# Patient Record
Sex: Male | Born: 1950 | Race: White | Hispanic: No | Marital: Married | Smoking: Former smoker
Health system: Southern US, Community
[De-identification: ages and names within clinical notes are randomized; demographics above are authoritative.]

## PROBLEM LIST (undated history)

## (undated) DIAGNOSIS — I10 Essential (primary) hypertension: Secondary | ICD-10-CM

## (undated) DIAGNOSIS — K635 Polyp of colon: Secondary | ICD-10-CM

## (undated) HISTORY — DX: Polyp of colon: K63.5

## (undated) HISTORY — PX: TONSILLECTOMY: SUR1361

## (undated) HISTORY — PX: EYE SURGERY: SHX253

---

## 1998-04-23 ENCOUNTER — Ambulatory Visit (HOSPITAL_COMMUNITY): Admission: RE | Admit: 1998-04-23 | Discharge: 1998-04-23 | Payer: Self-pay | Admitting: Gastroenterology

## 1999-08-26 LAB — HM COLONOSCOPY

## 2008-08-28 ENCOUNTER — Ambulatory Visit: Payer: Self-pay | Admitting: Internal Medicine

## 2008-08-28 DIAGNOSIS — N400 Enlarged prostate without lower urinary tract symptoms: Secondary | ICD-10-CM | POA: Insufficient documentation

## 2008-08-28 DIAGNOSIS — Z860100 Personal history of colon polyps, unspecified: Secondary | ICD-10-CM | POA: Insufficient documentation

## 2008-08-28 DIAGNOSIS — I10 Essential (primary) hypertension: Secondary | ICD-10-CM | POA: Insufficient documentation

## 2008-08-28 DIAGNOSIS — F17200 Nicotine dependence, unspecified, uncomplicated: Secondary | ICD-10-CM | POA: Insufficient documentation

## 2008-08-28 LAB — CONVERTED CEMR LAB
ALT: 25 units/L (ref 0–53)
AST: 28 units/L (ref 0–37)
Albumin: 4.1 g/dL (ref 3.5–5.2)
Alkaline Phosphatase: 80 units/L (ref 39–117)
BUN: 10 mg/dL (ref 6–23)
Basophils Absolute: 0 10*3/uL (ref 0.0–0.1)
Basophils Relative: 0.4 % (ref 0.0–3.0)
Bilirubin Urine: NEGATIVE
Bilirubin, Direct: 0.1 mg/dL (ref 0.0–0.3)
CO2: 33 meq/L — ABNORMAL HIGH (ref 19–32)
Calcium: 9.2 mg/dL (ref 8.4–10.5)
Chloride: 103 meq/L (ref 96–112)
Creatinine, Ser: 0.9 mg/dL (ref 0.4–1.5)
Eosinophils Absolute: 0.1 10*3/uL (ref 0.0–0.7)
Eosinophils Relative: 1.4 % (ref 0.0–5.0)
GFR calc non Af Amer: 92.79 mL/min (ref >60)
Glucose, Bld: 91 mg/dL (ref 70–99)
HCT: 44.3 % (ref 39.0–52.0)
Hemoglobin, Urine: NEGATIVE
Hemoglobin: 15.5 g/dL (ref 13.0–17.0)
Ketones, ur: NEGATIVE mg/dL
Leukocytes, UA: NEGATIVE
Lymphocytes Relative: 32.1 % (ref 12.0–46.0)
Lymphs Abs: 2.3 10*3/uL (ref 0.7–4.0)
MCHC: 35 g/dL (ref 30.0–36.0)
MCV: 95.1 fL (ref 78.0–100.0)
Monocytes Absolute: 0.4 10*3/uL (ref 0.1–1.0)
Monocytes Relative: 5.7 % (ref 3.0–12.0)
Neutro Abs: 4.5 10*3/uL (ref 1.4–7.7)
Neutrophils Relative %: 60.4 % (ref 43.0–77.0)
Nitrite: NEGATIVE
PSA: 0.33 ng/mL (ref 0.10–4.00)
Platelets: 123 10*3/uL — ABNORMAL LOW (ref 150.0–400.0)
Potassium: 4.2 meq/L (ref 3.5–5.1)
RBC: 4.66 M/uL (ref 4.22–5.81)
RDW: 13.5 % (ref 11.5–14.6)
Sodium: 142 meq/L (ref 135–145)
Specific Gravity, Urine: <=1.005 (ref 1.000–1.030)
TSH: 0.8 microintl units/mL (ref 0.35–5.50)
Total Bilirubin: 0.8 mg/dL (ref 0.3–1.2)
Total Protein, Urine: NEGATIVE mg/dL
Total Protein: 6.8 g/dL (ref 6.0–8.3)
Urine Glucose: NEGATIVE mg/dL
Urobilinogen, UA: 0.2 (ref 0.0–1.0)
WBC: 7.3 10*3/uL (ref 4.5–10.5)
pH: 6 (ref 5.0–8.0)

## 2008-08-29 ENCOUNTER — Encounter: Payer: Self-pay | Admitting: Internal Medicine

## 2008-09-11 ENCOUNTER — Ambulatory Visit: Payer: Self-pay | Admitting: Internal Medicine

## 2008-09-11 DIAGNOSIS — R9431 Abnormal electrocardiogram [ECG] [EKG]: Secondary | ICD-10-CM | POA: Insufficient documentation

## 2008-09-11 DIAGNOSIS — R0609 Other forms of dyspnea: Secondary | ICD-10-CM | POA: Insufficient documentation

## 2008-09-11 DIAGNOSIS — R0989 Other specified symptoms and signs involving the circulatory and respiratory systems: Secondary | ICD-10-CM | POA: Insufficient documentation

## 2008-09-18 ENCOUNTER — Telehealth (INDEPENDENT_AMBULATORY_CARE_PROVIDER_SITE_OTHER): Payer: Self-pay | Admitting: *Deleted

## 2008-12-05 ENCOUNTER — Encounter: Payer: Self-pay | Admitting: Internal Medicine

## 2008-12-23 ENCOUNTER — Ambulatory Visit: Payer: Self-pay | Admitting: Internal Medicine

## 2010-06-09 NOTE — Progress Notes (Signed)
Summary: Nuc pre procedure  Phone Note Outgoing Call Call back at [facility] Phone [phone]   Call placed by: Rea College, CMA,  Sep 18, 2008 3:37 PM Call placed to: Patient Summary of Call: Left message with information on Myoview Information Sheet (see scanned document for details).        Nuclear Med Background Indications for Stress Test: Evaluation for Ischemia, Abnormal EKG     Symptoms: DOE    Nuclear Pre-Procedure Cardiac Risk Factors: Hypertension, RBBB, Smoker Height (in): 69 Tech Comments: IRBBB  Appended Document: Nuc pre procedure Patient called 09/18/08 per IDX, cancelled myoview and patient will call back to reschedule.

## 2010-06-09 NOTE — Assessment & Plan Note (Signed)
Summary: 2 wk fu/$50/pn   Vital Signs:  Patient profile:   60 year old male Height:      69 inches Weight:      192.25 pounds O2 Sat:      98 % Temp:     98.4 degrees F oral Pulse rate:   92 / minute Pulse rhythm:   regular BP sitting:   150 / 90  (right arm) Cuff size:   large  Vitals Entered By: Rock Nephew CMA (Sep 11, 2008 1:49 PM) CC: 2wk follow up, Hypertension Management Is Patient Diabetic? No Pain Assessment Patient in pain? no        Primary Care Shaquela Weichert:  Etta Grandchild MD  CC:  2wk follow up and Hypertension Management.  History of Present Illness: Pt returns for f/up. He now reports some DOE with daily activities and is hesitant to exercise b/c of this.  Hypertension History:      He complains of dyspnea with exertion, but denies headache, chest pain, palpitations, orthopnea, PND, peripheral edema, visual symptoms, neurologic problems, syncope, and side effects from treatment.        Positive major cardiovascular risk factors include male age 83 years old or older, hypertension, and current tobacco user.  Negative major cardiovascular risk factors include no history of diabetes or hyperlipidemia and negative family history for ischemic heart disease.        Further assessment for target organ damage reveals no history of ASHD, cardiac end-organ damage (CHF/LVH), stroke/TIA, peripheral vascular disease, renal insufficiency, or hypertensive retinopathy.     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  The patient denies hoarseness, chest pain, syncope, peripheral edema, prolonged cough, headaches, hemoptysis, abdominal pain, hematuria, difficulty walking, enlarged lymph nodes, and angioedema.    Physical Exam  General:  alert, well-developed, well-nourished, and well-hydrated.   Neck:  supple, full ROM, and no masses.   Lungs:  Normal respiratory effort, chest expands symmetrically. Lungs are clear to auscultation, no crackles or wheezes. Heart:  Normal rate and regular rhythm. S1 and S2 normal without gallop, murmur, click, rub or other extra sounds. Abdomen:  soft, non-tender, normal bowel sounds, no distention, no masses, no guarding, no rigidity, no hepatomegaly, and no splenomegaly.   Msk:  normal ROM, no joint tenderness, and no joint swelling.  Pulses:  R and L carotid,radial,femoral,dorsalis pedis and posterior tibial pulses are full and equal bilaterally Extremities:  No clubbing, cyanosis, edema, or deformity noted with normal full range of motion of all joints.   Neurologic:  No cranial nerve deficits noted. Station and gait are normal. Plantar reflexes are down-going bilaterally. DTRs are symmetrical throughout. Sensory, motor and coordinative functions appear intact. Skin:  turgor normal, color normal, no rashes, no ulcerations, and no edema.   Cervical Nodes:  No lymphadenopathy noted Axillary Nodes:  No palpable lymphadenopathy Psych:  Cognition and judgment appear intact. Alert and cooperative with normal  attention span and concentration. No apparent delusions, illusions, hallucinations Additional Exam:  EKG shows LAE, RBBB, LAFB which obscure the T waves (flat). Also, there is a hint of a Q wave in V1 and V2.   Impression & Recommendations:  Problem # 1:  ELECTROCARDIOGRAM, ABNORMAL (ICD-794.31) Assessment New  Orders: EKG w/ Interpretation (93000) Cardiolite (Cardiolite)  Problem # 2:  DYSPNEA ON EXERTION (ICD-786.09) Assessment: New  Orders: EKG w/ Interpretation (93000) Cardiolite (Cardiolite)  Problem # 3:  ESSENTIAL HYPERTENSION (ICD-401.9) Assessment: New  His updated medication list for this problem includes:    Benicar 20 Mg Tabs (Olmesartan medoxomil) ..... Once daily  Orders: EKG w/ Interpretation (93000) Cardiolite (Cardiolite)  Complete Medication List: 1)  Benicar 20 Mg Tabs (Olmesartan medoxomil) .... Once daily  Hypertension Assessment/Plan:      The patient's hypertensive risk group is category B: At least one risk factor (excluding diabetes) with no target organ damage.  Today's blood pressure is 150/90.  His blood pressure goal is < 140/90.  Patient Instructions: 1)  Please schedule a follow-up appointment in 1 month. 2)  It is important that you exercise regularly at least 20 minutes 5 times a week. If you develop chest pain, have severe difficulty breathing, or feel very tired , stop exercising immediately and seek medical attention. 3)  You need to lose weight. Consider a lower calorie diet and regular exercise.  4)  Check your Blood Pressure regularly. If it is above 130/80: you should make an appointment. Prescriptions: BENICAR 20 MG TABS (OLMESARTAN MEDOXOMIL) once daily  #56 x 0   Entered and Authorized by:   Etta Grandchild MD   Signed by:   Etta Grandchild MD on 09/11/2008   Method used:   Historical   RxID:   [id number]

## 2010-06-09 NOTE — Assessment & Plan Note (Signed)
Summary: NEW PT  $50  MED COST -PKG/OFF-STC   Vital Signs:  Patient profile:   60 year old male Height:      69 inches (175.26 cm) Weight:      193.2 pounds (87.82 kg) BMI:     28.63 O2 Sat:      97 % Temp:     98.2 degrees F (36.78 degrees C) oral Pulse rate:   74 / minute BP supine:   132 / 90  (right arm) BP sitting:   128 / 92  (left arm) Cuff size:   large  Vitals Entered By: Orlan Leavens (August 28, 2008 4:12 PM)  Nutrition Counseling: Patient's BMI is greater than 25 and therefore counseled on weight management options. CC: New patient est., Preventive Care Is Patient Diabetic? No   Primary Care Provider:  Etta Grandchild MD  CC:  New patient est. and Preventive Care.  History of Present Illness: This is a new pt. to me who wants a complete check-up. He reports that his heart rate increases when he drinks a lot of caffeine but otherwise does not experience any CV complaints.  Preventive Screening-Counseling & Management     Alcohol drinks/day: <1     Alcohol type: beer     >5/day in last 3 mos: no     Alcohol Counseling: not indicated; use of alcohol is not excessive or problematic     Feels need to cut down: no     Feels annoyed by complaints: no     Feels guilty re: drinking: no     Needs 'eye opener' in am: yes     Smoking Status: current     Smoking Cessation Counseling: yes     Smoke Cessation Stage: precontemplative     Packs/Day: 1.0     Year Started: 1974     Pack years: 35     Does Patient Exercise: yes     Hepatitis Risk: no risk noted     HIV Risk: no risk noted     STD Risk: no risk noted     TSE monthly: yes     Testicular SE Education/Counseling to perform regular STE     Sun Exposure-Excessive: no     Sun Exposure Counseling: to decrease sun exposure     Seat Belt Use: yes     Helmet Use: yes     Firearms in the Home: no firearms in the home     Smoke Detectors: yes     Violence in the Home: no risk noted     Fall Risk Counseling: not  indicated; no significant falls noted      Sexual History:  currently monogamous.        Drug Use:  never.        Blood Transfusions:  no.    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  The patient denies anorexia, fever, weight loss, hoarseness, chest pain, syncope, dyspnea on exertion, peripheral edema, prolonged cough, headaches, hemoptysis, abdominal pain, melena, hematochezia, severe indigestion/heartburn, hematuria, incontinence, suspicious skin lesions, depression, unusual weight change, abnormal bleeding, enlarged lymph nodes, and testicular masses.   GU:  Complains of nocturia; denies decreased libido, discharge, dysuria, erectile dysfunction, genital sores, hematuria, incontinence, urinary frequency, and urinary hesitancy.  Physical Exam  General:  alert, well-developed, well-nourished, well-hydrated, appropriate dress, and overweight-appearing.   Head:  normocephalic and atraumatic.   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 Ears:  External ear exam shows no  significant lesions or deformities.  Otoscopic examination reveals clear canals, tympanic membranes are intact bilaterally without bulging, retraction, inflammation or discharge. Hearing is grossly normal bilaterally. Nose:  External nasal examination shows no deformity or inflammation. Nasal mucosa are pink and moist without lesions or exudates. Mouth:  Oral mucosa and oropharynx without lesions or exudates.  Teeth in good repair. Neck:  supple, full ROM, no masses, no thyromegaly, no thyroid nodules or tenderness, and no JVD.   Breasts:  No masses or gynecomastia noted Lungs:  Normal respiratory effort, chest expands symmetrically. Lungs are clear to auscultation, no crackles or wheezes. Heart:  Normal rate and regular rhythm. S1 and S2 normal without gallop, murmur, click, rub or other extra sounds. Abdomen:  soft, non-tender, normal bowel sounds, no distention, no masses, no guarding, no rigidity, no rebound tenderness, no abdominal hernia, no inguinal hernia, no hepatomegaly, and no splenomegaly.   Rectal:  heme negative stool.normal sphincter tone, no masses, no tenderness, no fissures, no fistulae, no perianal rash, and external hemorrhoid(s).   Genitalia:  circumcised, no hydrocele, no varicocele, no scrotal masses, no testicular masses or atrophy, no cutaneous lesions, and no urethral discharge.   Prostate:  no nodules, no asymmetry, no induration, and 1+ enlarged.   Msk:  normal ROM, no joint tenderness, no joint swelling, no joint warmth, no redness over joints, no joint deformities, no joint instability, and no crepitation.   Pulses:  R and L carotid,radial,femoral,dorsalis pedis and posterior tibial pulses are full and equal bilaterally Extremities:  No clubbing, cyanosis, edema, or deformity noted with normal full range of motion of all joints.   Neurologic:  alert & oriented X3, strength normal in all extremities, sensation intact to light touch, sensation intact to pinprick, gait  normal, and DTRs symmetrical and normal.   Skin:  turgor normal, color normal, no rashes, no suspicious lesions, no ecchymoses, no petechiae, no purpura, no ulcerations, and no edema.   Cervical Nodes:  No lymphadenopathy noted Axillary Nodes:  No palpable lymphadenopathy Psych:  Cognition and judgment appear intact. Alert and cooperative with normal attention span and concentration. No apparent delusions, illusions, hallucinations   Impression & Recommendations:  Problem # 1:  HYPERTROPHY PROSTATE W/UR OBST & OTH LUTS (ICD-600.01) Assessment New his symptoms are not prominent enough  to warrant meds or intervention at this time. Orders: TLB-PSA (Prostate Specific Antigen) (84153-PSA)  Problem # 2:  ELEVATED BLOOD PRESSURE WITHOUT DIAGNOSIS OF HYPERTENSION (ICD-796.2) Assessment: New  Orders: TLB-BMP (Basic Metabolic Panel-BMET) (80048-METABOL) TLB-CBC Platelet - w/Differential (85025-CBCD) TLB-Hepatic/Liver Function Pnl (80076-HEPATIC) TLB-TSH (Thyroid Stimulating Hormone) (84443-TSH) TLB-Udip w/ Micro (81001-URINE)  Problem # 3:  COLONIC POLYPS, HX OF (ICD-V12.72) Assessment: New  Orders: Gastroenterology Referral (GI)  Problem # 4:  TOBACCO USE (ICD-305.1) Assessment: Unchanged  Problem # 5:  Preventive Health Care (ICD-V70.0) He is not fasting today so no lipids were ordered.  Other Orders: Tdap => 34yrs IM (08657) Admin 1st Vaccine (84696)  Colorectal Screening:  Colonoscopy Results:    Date of Exam: 08/26/1999    Results: Adenomatous Polyp  Immunization & Chemoprophylaxis:    Tetanus vaccine: Tdap  (08/28/2008)  Patient Instructions: 1)  Please schedule a follow-up appointment in 2 weeks. 2)  Tobacco is very bad for your health and your loved ones! You Should stop smoking!. 3)  Stop Smoking Tips: Choose a Quit date. Cut down before the Quit date. decide what you will do as a substitute when you feel the urge to smoke(gum,toothpick,exercise). 4)  It is  important that you exercise regularly at least 20 minutes 5 times a week. If you develop chest pain, have severe difficulty breathing, or feel very tired , stop exercising immediately and seek medical attention. 5)  You need to lose weight. Consider a lower calorie diet and regular exercise.  6)  Check your Blood Pressure regularly. If it is above 140/90: you should make an appointment.      Preventive Care Screening  Hemoccult:    Date:  08/28/2008    Results:  negative  Last Tetanus Booster:    Date:  08/28/2008    Results:  Tdap   Colonoscopy:    Date:  05/11/1999    Results:  normal     Immunizations Administered:  Tetanus Vaccine:    Vaccine Type: Tdap    Site: left deltoid    Mfr: GlaxoSmithKline    Dose: 0.5 ml    Route: IM    Given by: Orlan Leavens    Exp. Date: 07/03/2010    Lot #: [id number]    VIS given: 08/28/08

## 2010-06-09 NOTE — Procedures (Signed)
Summary: Colonoscopy/[facility]  Colonoscopy/[facility]   Imported By: Sherian Rein 12/19/2008 13:18:36  _____________________________________________________________________  External Attachment:    Type:   Image     Comment:   External Document

## 2010-06-09 NOTE — Letter (Signed)
Summary: Results Follow-up Letter  [facility]  [address] [place], [place] [address]   Phone: [phone]  Fax: [phone]    08/29/2008  [address] [place], [place]  [address]  Dear Mr. Forton,   The following are the results of your recent test(s):  Test     Result     Prostate     normal Liver/kidney   normal Thyroid     normal Urine       normal CBC       normal  _________________________________________________________  Please call for an appointment as directed _________________________________________________________ _________________________________________________________ _________________________________________________________  Sincerely,  Sanda Linger MD [facility]           Appended Document: Results Follow-up Letter Mailed letter/LMB

## 2010-06-09 NOTE — Assessment & Plan Note (Signed)
Summary: MED REFILL/CD   Vital Signs:  Patient profile:   61 year old male Height:      69 inches Weight:      189 pounds BMI:     28.01 O2 Sat:      95 % on Room air Temp:     98.1 degrees F oral Pulse rate:   75 / minute Pulse rhythm:   regular Resp:     16 per minute BP sitting:   132 / 86  (left arm) Cuff size:   large  Vitals Entered By: Rock Nephew CMA (December 23, 2008 2:05 PM)  Nutrition Counseling: Patient's BMI is greater than 25 and therefore counseled on weight management options.  O2 Flow:  Room air CC: med refill, Hypertension Management   Primary Care Phyllis Abelson:  Etta Grandchild MD  CC:  med refill and Hypertension Management.  History of Present Illness: He returns for f/up and feels well. He's had no more DOE so he's decided not to do the cardiac testing. His BP at home stays around 130/60-70.   Hypertension History:      He denies headache, chest pain, palpitations, dyspnea with exertion, orthopnea, peripheral edema, visual symptoms, neurologic problems, syncope, and side effects from treatment.  He notes no problems with any antihypertensive medication side effects.        Positive major cardiovascular risk factors include male age 57 years old or older, hypertension, and current tobacco user.  Negative major cardiovascular risk factors include no history of diabetes or hyperlipidemia and negative family history for ischemic heart disease.        Further assessment for target organ damage reveals no history of ASHD, cardiac end-organ damage (CHF/LVH), stroke/TIA, peripheral vascular disease, renal insufficiency, or hypertensive retinopathy.     Preventive Screening-Counseling & Management  Alcohol-Tobacco     Smoking Status: current     Smoking Cessation Counseling: yes     Smoke Cessation Stage: contemplative     Packs/Day: 1.5     Year Started: 1975  Current Medications (verified): 1)  Benicar 20 Mg Tabs (Olmesartan Medoxomil) .... Once  Daily  Allergies (verified): No Known Drug Allergies  Past History:  Past Medical History: Reviewed history from 08/28/2008 and no changes required. Colonic polyps, hx of  Past Surgical History: Reviewed history from 08/28/2008 and no changes required. 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 Family History of Colon CA 1st degree relative <60 (parent)  Social History: Reviewed history from 08/28/2008 and no changes required. Current Smoker Alcohol use-yes Occupation: Horticulturist, commercial Married Regular exercise-yes Packs/Day:  1.5  Review of Systems  The patient denies anorexia, fever, prolonged cough, hemoptysis, abdominal pain, difficulty walking, and depression.    Physical Exam  General:  alert, well-developed, well-nourished, and well-hydrated.   Neck:  supple, full ROM, and no masses.   Lungs:  Normal respiratory effort, chest expands symmetrically. Lungs are clear to auscultation, no crackles or wheezes. Heart:  Normal rate and regular rhythm. S1 and S2 normal without gallop, murmur, click, rub or other extra sounds. Abdomen:  soft, non-tender, normal bowel sounds, no distention, no masses, no guarding, no rigidity, no hepatomegaly, and no splenomegaly.   Extremities:  No clubbing, cyanosis, edema, or deformity noted with normal full range of motion of all joints.   Skin:  turgor normal, color normal, no rashes, no ulcerations, and no edema.   Psych:  Cognition and judgment  appear intact. Alert and cooperative with normal attention span and concentration. No apparent delusions, illusions, hallucinations   Impression & Recommendations:  Problem # 1:  ELECTROCARDIOGRAM, ABNORMAL (ICD-794.31)  will do cardiolite when he's ready  Orders: Tobacco use cessation intermediate 3-10 minutes (16109)  Problem # 2:  ESSENTIAL HYPERTENSION (ICD-401.9) Assessment: Improved  His updated  medication list for this problem includes:    Benicar 20 Mg Tabs (Olmesartan medoxomil) ..... Once daily  Orders: Tobacco use cessation intermediate 3-10 minutes (60454)  Problem # 3:  TOBACCO USE (ICD-305.1) Assessment: Unchanged  Orders: Tobacco use cessation intermediate 3-10 minutes (99406)  Complete Medication List: 1)  Benicar 20 Mg Tabs (Olmesartan medoxomil) .... Once daily  Hypertension Assessment/Plan:      The patient's hypertensive risk group is category B: At least one risk factor (excluding diabetes) with no target organ damage.  Today's blood pressure is 132/86.  His blood pressure goal is < 140/90.  Patient Instructions: 1)  Please schedule a follow-up appointment in 4 months. 2)  Tobacco is very bad for your health and your loved ones! You Should stop smoking!. 3)  Stop Smoking Tips: Choose a Quit date. Cut down before the Quit date. decide what you will do as a substitute when you feel the urge to smoke(gum,toothpick,exercise). 4)  It is important that you exercise regularly at least 20 minutes 5 times a week. If you develop chest pain, have severe difficulty breathing, or feel very tired , stop exercising immediately and seek medical attention. 5)  You need to lose weight. Consider a lower calorie diet and regular exercise.  6)  Check your Blood Pressure regularly. If it is above 140/90: you should make an appointment. Prescriptions: BENICAR 20 MG TABS (OLMESARTAN MEDOXOMIL) once daily  #30 x 11   Entered and Authorized by:   Etta Grandchild MD   Signed by:   Etta Grandchild MD on 12/23/2008   Method used:   Print then Give to Patient   RxID:   [id number]

## 2010-08-26 ENCOUNTER — Ambulatory Visit (INDEPENDENT_AMBULATORY_CARE_PROVIDER_SITE_OTHER): Payer: PRIVATE HEALTH INSURANCE | Admitting: Internal Medicine

## 2010-08-26 ENCOUNTER — Encounter: Payer: Self-pay | Admitting: Internal Medicine

## 2010-08-26 ENCOUNTER — Other Ambulatory Visit (INDEPENDENT_AMBULATORY_CARE_PROVIDER_SITE_OTHER): Payer: PRIVATE HEALTH INSURANCE

## 2010-08-26 DIAGNOSIS — F17200 Nicotine dependence, unspecified, uncomplicated: Secondary | ICD-10-CM

## 2010-08-26 DIAGNOSIS — I10 Essential (primary) hypertension: Secondary | ICD-10-CM

## 2010-08-26 DIAGNOSIS — N401 Enlarged prostate with lower urinary tract symptoms: Secondary | ICD-10-CM

## 2010-08-26 DIAGNOSIS — N138 Other obstructive and reflux uropathy: Secondary | ICD-10-CM

## 2010-08-26 LAB — CBC WITH DIFFERENTIAL/PLATELET
Basophils Absolute: 0 10*3/uL (ref 0.0–0.1)
Basophils Relative: 0.3 % (ref 0.0–3.0)
Eosinophils Absolute: 0.1 10*3/uL (ref 0.0–0.7)
Eosinophils Relative: 1 % (ref 0.0–5.0)
HCT: 44.7 % (ref 39.0–52.0)
Hemoglobin: 15.5 g/dL (ref 13.0–17.0)
Lymphocytes Relative: 27.7 % (ref 12.0–46.0)
Lymphs Abs: 1.9 10*3/uL (ref 0.7–4.0)
MCHC: 34.7 g/dL (ref 30.0–36.0)
MCV: 94.9 fl (ref 78.0–100.0)
Monocytes Absolute: 0.3 10*3/uL (ref 0.1–1.0)
Monocytes Relative: 5.1 % (ref 3.0–12.0)
Neutro Abs: 4.4 10*3/uL (ref 1.4–7.7)
Neutrophils Relative %: 65.9 % (ref 43.0–77.0)
Platelets: 136 10*3/uL — ABNORMAL LOW (ref 150.0–400.0)
RBC: 4.71 Mil/uL (ref 4.22–5.81)
RDW: 14.3 % (ref 11.5–14.6)
WBC: 6.7 10*3/uL (ref 4.5–10.5)

## 2010-08-26 LAB — BASIC METABOLIC PANEL
BUN: 12 mg/dL (ref 6–23)
CO2: 30 mEq/L (ref 19–32)
Calcium: 9.7 mg/dL (ref 8.4–10.5)
Chloride: 102 mEq/L (ref 96–112)
Creatinine, Ser: 0.9 mg/dL (ref 0.4–1.5)
GFR: 90.97 mL/min (ref >60.00)
Glucose, Bld: 98 mg/dL (ref 70–99)
Potassium: 4.9 mEq/L (ref 3.5–5.1)
Sodium: 140 mEq/L (ref 135–145)

## 2010-08-26 LAB — PSA: PSA: 0.41 ng/mL (ref 0.10–4.00)

## 2010-08-26 MED ORDER — OLMESARTAN MEDOXOMIL 20 MG PO TABS: 20.0000 mg | ORAL_TABLET | Freq: Every day | ORAL | Status: DC

## 2010-08-26 NOTE — Assessment & Plan Note (Signed)
This is unchanged and he is not interested in quitting at this time

## 2010-08-26 NOTE — Patient Instructions (Signed)

## 2010-08-26 NOTE — Assessment & Plan Note (Signed)
He has no s/s today I will check his PSA to look for cancer concerns

## 2010-08-26 NOTE — Progress Notes (Signed)
Subjective:    Patient ID: Jaime Castillo, male    DOB: 09-06-1950, 60 y.o.   MRN: [id number]  Hypertension This is a chronic problem. The current episode started more than 1 year ago. The problem has been gradually improving since onset. The problem is controlled. Pertinent negatives include no anxiety, blurred vision, chest pain, headaches, malaise/fatigue, neck pain, orthopnea, palpitations, peripheral edema, PND, shortness of breath or sweats. There are no associated agents to hypertension. The current treatment provides significant improvement. There are no compliance problems.       Review of Systems  Constitutional: Negative for fever, chills, malaise/fatigue, diaphoresis, activity change, appetite change, fatigue and unexpected weight change.  HENT: Negative for facial swelling, neck pain and neck stiffness.   Eyes: Negative for blurred vision.  Respiratory: Negative for cough, choking, chest tightness, shortness of breath, wheezing and stridor.   Cardiovascular: Negative for chest pain, palpitations, orthopnea, leg swelling and PND.  Gastrointestinal: Negative for nausea, vomiting, abdominal pain, diarrhea, blood in stool and abdominal distention.  Genitourinary: Negative for dysuria, urgency, frequency, hematuria, decreased urine volume, enuresis and difficulty urinating.  Musculoskeletal: Negative for myalgias, back pain, joint swelling, arthralgias and gait problem.  Skin: Negative for color change, pallor and rash.  Neurological: Negative for dizziness, tremors, syncope, facial asymmetry, weakness, numbness and headaches.  Hematological: Negative for adenopathy. Does not bruise/bleed easily.  Psychiatric/Behavioral: Negative for behavioral problems, confusion, self-injury and agitation. The patient is not nervous/anxious.        Lab Results  Component Value Date   WBC 7.3 08/28/2008   HGB 15.5 08/28/2008   HCT 44.3 08/28/2008   PLT 123.0* 08/28/2008   ALT 25 08/28/2008   AST  28 08/28/2008   NA 142 08/28/2008   K 4.2 08/28/2008   CL 103 08/28/2008   CREATININE 0.9 08/28/2008   BUN 10 08/28/2008   CO2 33* 08/28/2008   TSH 0.80 08/28/2008   PSA 0.33 08/28/2008   Objective:   Physical Exam  Constitutional: He is oriented to person, place, and time. He appears well-developed and well-nourished. No distress.  HENT:  Head: Normocephalic and atraumatic.  Right Ear: External ear normal.  Left Ear: External ear normal.  Nose: Nose normal.  Mouth/Throat: Oropharynx is clear and moist. No oropharyngeal exudate.  Eyes: Conjunctivae and EOM are normal. Pupils are equal, round, and reactive to light. Right eye exhibits no discharge. Left eye exhibits no discharge. No scleral icterus.  Neck: Normal range of motion. Neck supple. No JVD present. No thyromegaly present.  Cardiovascular: Normal rate, regular rhythm, normal heart sounds and intact distal pulses.  Exam reveals no gallop and no friction rub.   No murmur heard. Pulmonary/Chest: Effort normal and breath sounds normal. No respiratory distress. He has no wheezes. He has no rales. He exhibits no tenderness.  Abdominal: Soft. Bowel sounds are normal. He exhibits no distension and no mass. There is no tenderness. There is no rebound and no guarding.  Musculoskeletal: Normal range of motion. He exhibits no edema and no tenderness.  Lymphadenopathy:    He has no cervical adenopathy.  Neurological: He is alert and oriented to person, place, and time. He has normal reflexes. He exhibits normal muscle tone. Coordination normal.  Skin: Skin is warm and dry. No rash noted. He is not diaphoretic. No erythema. No pallor.  Psychiatric: He has a normal mood and affect. His behavior is normal. Judgment and thought content normal.          Assessment &  Plan:

## 2010-08-26 NOTE — Assessment & Plan Note (Signed)
His BP is well controlled, I will monitor his lytes and renal function

## 2011-08-15 ENCOUNTER — Other Ambulatory Visit: Payer: Self-pay | Admitting: Internal Medicine

## 2012-02-22 ENCOUNTER — Other Ambulatory Visit: Payer: Self-pay | Admitting: Internal Medicine

## 2012-02-24 ENCOUNTER — Telehealth: Payer: Self-pay | Admitting: Internal Medicine

## 2012-02-24 NOTE — Telephone Encounter (Signed)
The patient has scheduled a cpe for next week but is in need of a refill until then, thanks!

## 2012-02-25 ENCOUNTER — Other Ambulatory Visit: Payer: Self-pay | Admitting: Internal Medicine

## 2012-02-25 ENCOUNTER — Telehealth: Payer: Self-pay | Admitting: Internal Medicine

## 2012-02-25 MED ORDER — OLMESARTAN MEDOXOMIL 20 MG PO TABS: 20.0000 mg | ORAL_TABLET | Freq: Every day | ORAL | Status: DC

## 2012-02-25 NOTE — Telephone Encounter (Signed)
Rx faxed to pharmacy  

## 2012-02-25 NOTE — Telephone Encounter (Signed)
Patient needs a refill on Benicar sent to [facility]

## 2012-03-01 ENCOUNTER — Ambulatory Visit (INDEPENDENT_AMBULATORY_CARE_PROVIDER_SITE_OTHER): Payer: PRIVATE HEALTH INSURANCE | Admitting: Internal Medicine

## 2012-03-01 ENCOUNTER — Other Ambulatory Visit (INDEPENDENT_AMBULATORY_CARE_PROVIDER_SITE_OTHER): Payer: PRIVATE HEALTH INSURANCE

## 2012-03-01 ENCOUNTER — Encounter: Payer: Self-pay | Admitting: Internal Medicine

## 2012-03-01 VITALS — BP 118/80 | HR 70 | Temp 97.0°F | Resp 14 | Ht 68.0 in | Wt 195.5 lb

## 2012-03-01 DIAGNOSIS — I10 Essential (primary) hypertension: Secondary | ICD-10-CM

## 2012-03-01 LAB — BASIC METABOLIC PANEL
BUN: 11 mg/dL (ref 6–23)
CO2: 31 mEq/L (ref 19–32)
Calcium: 9.2 mg/dL (ref 8.4–10.5)
Chloride: 101 mEq/L (ref 96–112)
Creatinine, Ser: 1 mg/dL (ref 0.4–1.5)
GFR: 83.08 mL/min (ref >60.00)
Glucose, Bld: 96 mg/dL (ref 70–99)
Potassium: 4.3 mEq/L (ref 3.5–5.1)
Sodium: 138 mEq/L (ref 135–145)

## 2012-03-01 NOTE — Assessment & Plan Note (Signed)
His BP is well controlled, I will check his lytes and renal function 

## 2012-03-01 NOTE — Progress Notes (Signed)
  Subjective:    Patient ID: Jaime Castillo, male    DOB: 05-Oct-1950, 61 y.o.   MRN: [id number]  Hypertension This is a chronic problem. The current episode started more than 1 year ago. The problem has been gradually improving since onset. The problem is controlled. Pertinent negatives include no anxiety, blurred vision, chest pain, headaches, malaise/fatigue, neck pain, orthopnea, palpitations, peripheral edema, PND, shortness of breath or sweats. There are no associated agents to hypertension. Past treatments include angiotensin blockers. Compliance problems include exercise and diet.       Review of Systems  Constitutional: Negative for fever, chills, malaise/fatigue, diaphoresis, activity change, appetite change, fatigue and unexpected weight change.  HENT: Negative.  Negative for neck pain.   Eyes: Negative.  Negative for blurred vision.  Respiratory: Negative for apnea, cough, choking, chest tightness, shortness of breath, wheezing and stridor.   Cardiovascular: Negative for chest pain, palpitations, orthopnea, leg swelling and PND.  Gastrointestinal: Negative.   Genitourinary: Negative.   Musculoskeletal: Negative.   Skin: Negative.   Neurological: Negative.  Negative for dizziness, facial asymmetry, weakness and headaches.  Hematological: Negative.   Psychiatric/Behavioral: Negative.        Objective:   Physical Exam  Vitals reviewed. Constitutional: He is oriented to person, place, and time. He appears well-developed and well-nourished. No distress.  HENT:  Head: Normocephalic and atraumatic.  Mouth/Throat: Oropharynx is clear and moist. No oropharyngeal exudate.  Eyes: Conjunctivae normal are normal. Right eye exhibits no discharge. Left eye exhibits no discharge. No scleral icterus.  Neck: Normal range of motion. Neck supple. No JVD present. No tracheal deviation present. No thyromegaly present.  Cardiovascular: Normal rate, regular rhythm, normal heart sounds and intact  distal pulses.  Exam reveals no gallop and no friction rub.   No murmur heard. Pulmonary/Chest: Effort normal and breath sounds normal. No stridor. No respiratory distress. He has no wheezes. He has no rales. He exhibits no tenderness.  Abdominal: Soft. Bowel sounds are normal. He exhibits no distension and no mass. There is no tenderness. There is no rebound and no guarding.  Musculoskeletal: Normal range of motion. He exhibits no edema and no tenderness.  Lymphadenopathy:    He has no cervical adenopathy.  Neurological: He is oriented to person, place, and time.  Skin: Skin is warm and dry. No rash noted. He is not diaphoretic. No erythema. No pallor.  Psychiatric: He has a normal mood and affect. His behavior is normal. Judgment and thought content normal.     Lab Results  Component Value Date   WBC 6.7 08/26/2010   HGB 15.5 08/26/2010   HCT 44.7 08/26/2010   PLT 136.0* 08/26/2010   GLUCOSE 98 08/26/2010   ALT 25 08/28/2008   AST 28 08/28/2008   NA 140 08/26/2010   K 4.9 08/26/2010   CL 102 08/26/2010   CREATININE 0.9 08/26/2010   BUN 12 08/26/2010   CO2 30 08/26/2010   TSH 0.80 08/28/2008   PSA 0.41 08/26/2010       Assessment & Plan:

## 2012-03-01 NOTE — Patient Instructions (Signed)

## 2012-08-08 ENCOUNTER — Other Ambulatory Visit (INDEPENDENT_AMBULATORY_CARE_PROVIDER_SITE_OTHER): Payer: PRIVATE HEALTH INSURANCE

## 2012-08-08 ENCOUNTER — Ambulatory Visit (INDEPENDENT_AMBULATORY_CARE_PROVIDER_SITE_OTHER): Payer: PRIVATE HEALTH INSURANCE | Admitting: Internal Medicine

## 2012-08-08 ENCOUNTER — Ambulatory Visit (INDEPENDENT_AMBULATORY_CARE_PROVIDER_SITE_OTHER)
Admission: RE | Admit: 2012-08-08 | Discharge: 2012-08-08 | Disposition: A | Discharge status: Home / Self Care | Payer: PRIVATE HEALTH INSURANCE | Source: Ambulatory Visit | Attending: Internal Medicine | Admitting: Internal Medicine

## 2012-08-08 ENCOUNTER — Encounter: Payer: Self-pay | Admitting: Internal Medicine

## 2012-08-08 VITALS — BP 122/78 | HR 72 | Temp 97.7°F | Resp 16 | Wt 179.8 lb

## 2012-08-08 DIAGNOSIS — M79669 Pain in unspecified lower leg: Secondary | ICD-10-CM | POA: Insufficient documentation

## 2012-08-08 DIAGNOSIS — I10 Essential (primary) hypertension: Secondary | ICD-10-CM

## 2012-08-08 DIAGNOSIS — Z0000 Encounter for general adult medical examination without abnormal findings: Secondary | ICD-10-CM

## 2012-08-08 DIAGNOSIS — F17200 Nicotine dependence, unspecified, uncomplicated: Secondary | ICD-10-CM

## 2012-08-08 DIAGNOSIS — M79609 Pain in unspecified limb: Secondary | ICD-10-CM

## 2012-08-08 DIAGNOSIS — Z86010 Personal history of colonic polyps: Secondary | ICD-10-CM

## 2012-08-08 LAB — COMPREHENSIVE METABOLIC PANEL
ALT: 22 U/L (ref 0–53)
AST: 22 U/L (ref 0–37)
Albumin: 4.4 g/dL (ref 3.5–5.2)
Alkaline Phosphatase: 69 U/L (ref 39–117)
BUN: 13 mg/dL (ref 6–23)
CO2: 29 mEq/L (ref 19–32)
Calcium: 9.5 mg/dL (ref 8.4–10.5)
Chloride: 101 mEq/L (ref 96–112)
Creatinine, Ser: 1 mg/dL (ref 0.4–1.5)
GFR: 81.99 mL/min (ref >60.00)
Glucose, Bld: 105 mg/dL — ABNORMAL HIGH (ref 70–99)
Potassium: 4.6 mEq/L (ref 3.5–5.1)
Sodium: 138 mEq/L (ref 135–145)
Total Bilirubin: 0.7 mg/dL (ref 0.3–1.2)
Total Protein: 6.9 g/dL (ref 6.0–8.3)

## 2012-08-08 LAB — CBC WITH DIFFERENTIAL/PLATELET
Basophils Absolute: 0 10*3/uL (ref 0.0–0.1)
Basophils Relative: 0.2 % (ref 0.0–3.0)
Eosinophils Absolute: 0.1 10*3/uL (ref 0.0–0.7)
Eosinophils Relative: 1.1 % (ref 0.0–5.0)
HCT: 44.5 % (ref 39.0–52.0)
Hemoglobin: 15.3 g/dL (ref 13.0–17.0)
Lymphocytes Relative: 24 % (ref 12.0–46.0)
Lymphs Abs: 1.8 10*3/uL (ref 0.7–4.0)
MCHC: 34.4 g/dL (ref 30.0–36.0)
MCV: 94.4 fl (ref 78.0–100.0)
Monocytes Absolute: 0.4 10*3/uL (ref 0.1–1.0)
Monocytes Relative: 5.3 % (ref 3.0–12.0)
Neutro Abs: 5.1 10*3/uL (ref 1.4–7.7)
Neutrophils Relative %: 69.4 % (ref 43.0–77.0)
Platelets: 153 10*3/uL (ref 150.0–400.0)
RBC: 4.72 Mil/uL (ref 4.22–5.81)
RDW: 13.7 % (ref 11.5–14.6)
WBC: 7.4 10*3/uL (ref 4.5–10.5)

## 2012-08-08 LAB — URINALYSIS, ROUTINE W REFLEX MICROSCOPIC
Bilirubin Urine: NEGATIVE
Hgb urine dipstick: NEGATIVE
Ketones, ur: NEGATIVE
Leukocytes, UA: NEGATIVE
Nitrite: NEGATIVE
Specific Gravity, Urine: <=1.005 (ref 1.000–1.030)
Total Protein, Urine: NEGATIVE
Urine Glucose: NEGATIVE
Urobilinogen, UA: 0.2 (ref 0.0–1.0)
pH: 6 (ref 5.0–8.0)

## 2012-08-08 LAB — TSH: TSH: 0.72 u[IU]/mL (ref 0.35–5.50)

## 2012-08-08 LAB — HEPATITIS C ANTIBODY: HCV Ab: NEGATIVE

## 2012-08-08 LAB — PSA: PSA: 0.4 ng/mL (ref 0.10–4.00)

## 2012-08-08 LAB — LIPID PANEL
Cholesterol: 161 mg/dL (ref 0–200)
HDL: 31.5 mg/dL — ABNORMAL LOW (ref >39.00)
LDL Cholesterol: 99 mg/dL (ref 0–99)
Total CHOL/HDL Ratio: 5
Triglycerides: 151 mg/dL — ABNORMAL HIGH (ref 0.0–149.0)
VLDL: 30.2 mg/dL (ref 0.0–40.0)

## 2012-08-08 LAB — FECAL OCCULT BLOOD, GUAIAC: Fecal Occult Blood: NEGATIVE

## 2012-08-08 MED ORDER — OLMESARTAN MEDOXOMIL 20 MG PO TABS: 20.0000 mg | ORAL_TABLET | Freq: Every day | ORAL | Status: DC

## 2012-08-08 NOTE — Assessment & Plan Note (Signed)
He is not willing to quit smoking 

## 2012-08-08 NOTE — Patient Instructions (Signed)
Health Maintenance, Males A healthy lifestyle and preventative care can promote health and wellness.  Maintain regular health, dental, and eye exams.  Eat a healthy diet. Foods like vegetables, fruits, whole grains, low-fat dairy products, and lean protein foods contain the nutrients you need without too many calories. Decrease your intake of foods high in solid fats, added sugars, and salt. Get information about a proper diet from your caregiver, if necessary.  Regular physical exercise is one of the most important things you can do for your health. Most adults should get at least 150 minutes of moderate-intensity exercise (any activity that increases your heart rate and causes you to sweat) each week. In addition, most adults need muscle-strengthening exercises on 2 or more days a week.   Maintain a healthy weight. The body mass index (BMI) is a screening tool to identify possible weight problems. It provides an estimate of body fat based on height and weight. Your caregiver can help determine your BMI, and can help you achieve or maintain a healthy weight. For adults 20 years and older:  A BMI below 18.5 is considered underweight.  A BMI of 18.5 to 24.9 is normal.  A BMI of 25 to 29.9 is considered overweight.  A BMI of 30 and above is considered obese.  Maintain normal blood lipids and cholesterol by exercising and minimizing your intake of saturated fat. Eat a balanced diet with plenty of fruits and vegetables. Blood tests for lipids and cholesterol should begin at age 20 and be repeated every 5 years. If your lipid or cholesterol levels are high, you are over 50, or you are a high risk for heart disease, you may need your cholesterol levels checked more frequently.Ongoing high lipid and cholesterol levels should be treated with medicines, if diet and exercise are not effective.  If you smoke, find out from your caregiver how to quit. If you do not use tobacco, do not start.  If you  choose to drink alcohol, do not exceed 2 drinks per day. One drink is considered to be 12 ounces (355 mL) of beer, 5 ounces (148 mL) of wine, or 1.5 ounces (44 mL) of liquor.  Avoid use of street drugs. Do not share needles with anyone. Ask for help if you need support or instructions about stopping the use of drugs.  High blood pressure causes heart disease and increases the risk of stroke. Blood pressure should be checked at least every 1 to 2 years. Ongoing high blood pressure should be treated with medicines if weight loss and exercise are not effective.  If you are 45 to 60 years old, ask your caregiver if you should take aspirin to prevent heart disease.  Diabetes screening involves taking a blood sample to check your fasting blood sugar level. This should be done once every 3 years, after age 45, if you are within normal weight and without risk factors for diabetes. Testing should be considered at a younger age or be carried out more frequently if you are overweight and have at least 1 risk factor for diabetes.  Colorectal cancer can be detected and often prevented. Most routine colorectal cancer screening begins at the age of 50 and continues through age 75. However, your caregiver may recommend screening at an earlier age if you have risk factors for colon cancer. On a yearly basis, your caregiver may provide home test kits to check for hidden blood in the stool. Use of a small camera at the end of a tube,   to directly examine the colon (sigmoidoscopy or colonoscopy), can detect the earliest forms of colorectal cancer. Talk to your caregiver about this at age 50, when routine screening begins. Direct examination of the colon should be repeated every 5 to 10 years through age 75, unless early forms of pre-cancerous polyps or small growths are found.  Hepatitis C blood testing is recommended for all people born from 1945 through 1965 and any individual with known risks for hepatitis C.  Healthy  men should no longer receive prostate-specific antigen (PSA) blood tests as part of routine cancer screening. Consult with your caregiver about prostate cancer screening.  Testicular cancer screening is not recommended for adolescents or adult males who have no symptoms. Screening includes self-exam, caregiver exam, and other screening tests. Consult with your caregiver about any symptoms you have or any concerns you have about testicular cancer.  Practice safe sex. Use condoms and avoid high-risk sexual practices to reduce the spread of sexually transmitted infections (STIs).  Use sunscreen with a sun protection factor (SPF) of 30 or greater. Apply sunscreen liberally and repeatedly throughout the day. You should seek shade when your shadow is shorter than you. Protect yourself by wearing long sleeves, pants, a wide-brimmed hat, and sunglasses year round, whenever you are outdoors.  Notify your caregiver of new moles or changes in moles, especially if there is a change in shape or color. Also notify your caregiver if a mole is larger than the size of a pencil eraser.  A one-time screening for abdominal aortic aneurysm (AAA) and surgical repair of large AAAs by sound wave imaging (ultrasonography) is recommended for ages 65 to 75 years who are current or former smokers.  Stay current with your immunizations. Document Released: 10/23/2007 Document Revised: 07/19/2011 Document Reviewed: 09/21/2010 [employer] Patient Information 2013 [employer].  

## 2012-08-08 NOTE — Assessment & Plan Note (Signed)
His BP is well controlled Today I will check his lytes and renal function 

## 2012-08-08 NOTE — Assessment & Plan Note (Signed)
He has been referred for a f/up colonoscopy

## 2012-08-08 NOTE — Assessment & Plan Note (Signed)
Exam done Vaccines were reviewed Labs ordered He was referred for a colonoscopy Pt ed material was given 

## 2012-08-08 NOTE — Progress Notes (Signed)
Subjective:    Patient ID: Jaime Castillo, male    DOB: 1950-06-17, 62 y.o.   MRN: [id number]  Hypertension This is a chronic problem. The current episode started more than 1 year ago. The problem has been gradually improving since onset. The problem is controlled. Pertinent negatives include no anxiety, blurred vision, chest pain, headaches, malaise/fatigue, neck pain, orthopnea, palpitations, peripheral edema, PND, shortness of breath or sweats. Risk factors for coronary artery disease include smoking/tobacco exposure. Past treatments include angiotensin blockers. The current treatment provides significant improvement. There are no compliance problems.       Review of Systems  Constitutional: Negative for fever, chills, malaise/fatigue, diaphoresis, activity change, appetite change, fatigue and unexpected weight change.  HENT: Negative.  Negative for neck pain.   Eyes: Negative.  Negative for blurred vision.  Respiratory: Negative.  Negative for cough, choking, chest tightness, shortness of breath, wheezing and stridor.   Cardiovascular: Negative.  Negative for chest pain, palpitations, orthopnea, leg swelling and PND.  Gastrointestinal: Positive for constipation. Negative for nausea, vomiting, abdominal pain, diarrhea, blood in stool, anal bleeding and rectal pain.  Endocrine: Negative.   Genitourinary: Negative.   Musculoskeletal: Positive for arthralgias (left lower leg pain for several months). Negative for myalgias, back pain, joint swelling and gait problem.  Skin: Negative for color change, pallor, rash and wound.  Allergic/Immunologic: Negative.   Neurological: Negative.  Negative for headaches.  Hematological: Negative.  Negative for adenopathy. Does not bruise/bleed easily.  Psychiatric/Behavioral: Negative.        Objective:   Physical Exam  Vitals reviewed. Constitutional: He is oriented to person, place, and time. He appears well-developed and well-nourished. No  distress.  HENT:  Head: Normocephalic and atraumatic.  Mouth/Throat: Oropharynx is clear and moist. No oropharyngeal exudate.  Eyes: Conjunctivae are normal. Right eye exhibits no discharge. Left eye exhibits no discharge. No scleral icterus.  Neck: Normal range of motion. Neck supple. No JVD present. No tracheal deviation present. No thyromegaly present.  Cardiovascular: Normal rate, regular rhythm, normal heart sounds and intact distal pulses.  Exam reveals no gallop and no friction rub.   No murmur heard. Pulmonary/Chest: Effort normal and breath sounds normal. No stridor. No respiratory distress. He has no wheezes. He has no rales. He exhibits no tenderness.  Abdominal: Soft. Bowel sounds are normal. He exhibits no distension and no mass. There is no tenderness. There is no rebound and no guarding. Hernia confirmed negative in the right inguinal area and confirmed negative in the left inguinal area.  Genitourinary: Prostate normal, testes normal and penis normal. Rectal exam shows external hemorrhoid and internal hemorrhoid. Rectal exam shows no fissure, no mass, no tenderness and anal tone normal. Guaiac negative stool. Prostate is not enlarged and not tender. Right testis shows no mass, no swelling and no tenderness. Right testis is descended. Left testis shows no mass, no swelling and no tenderness. Left testis is descended. Circumcised. No penile erythema or penile tenderness. No discharge found.  Musculoskeletal: Normal range of motion. He exhibits no edema and no tenderness.       Left lower leg: Normal. He exhibits no tenderness, no bony tenderness, no swelling, no edema, no deformity and no laceration.  Lymphadenopathy:    He has no cervical adenopathy.       Right: No inguinal adenopathy present.       Left: No inguinal adenopathy present.  Neurological: He is oriented to person, place, and time.  Skin: Skin is warm and dry. No  rash noted. He is not diaphoretic. No erythema. No pallor.   Psychiatric: He has a normal mood and affect. His behavior is normal. Judgment and thought content normal.     Lab Results  Component Value Date   WBC 6.7 08/26/2010   HGB 15.5 08/26/2010   HCT 44.7 08/26/2010   PLT 136.0* 08/26/2010   GLUCOSE 96 03/01/2012   ALT 25 08/28/2008   AST 28 08/28/2008   NA 138 03/01/2012   K 4.3 03/01/2012   CL 101 03/01/2012   CREATININE 1.0 03/01/2012   BUN 11 03/01/2012   CO2 31 03/01/2012   TSH 0.80 08/28/2008   PSA 0.41 08/26/2010       Assessment & Plan:

## 2012-08-09 ENCOUNTER — Encounter: Payer: Self-pay | Admitting: Internal Medicine

## 2012-09-26 ENCOUNTER — Encounter (HOSPITAL_COMMUNITY): Payer: Self-pay | Admitting: Pharmacy Technician

## 2012-10-03 ENCOUNTER — Encounter (HOSPITAL_COMMUNITY): Payer: Self-pay | Admitting: *Deleted

## 2012-10-11 ENCOUNTER — Other Ambulatory Visit: Payer: Self-pay | Admitting: Gastroenterology

## 2012-10-11 NOTE — Addendum Note (Signed)
Addended byVida Rigger on: 10/11/2012 11:44 AM   Modules accepted: Orders

## 2012-10-17 ENCOUNTER — Ambulatory Visit (HOSPITAL_COMMUNITY): Payer: PRIVATE HEALTH INSURANCE | Admitting: Anesthesiology

## 2012-10-17 ENCOUNTER — Encounter (HOSPITAL_COMMUNITY)
Admission: RE | Disposition: A | Discharge status: Home / Self Care | Payer: Self-pay | Source: Ambulatory Visit | Attending: Gastroenterology

## 2012-10-17 ENCOUNTER — Encounter (HOSPITAL_COMMUNITY): Payer: Self-pay | Admitting: Anesthesiology

## 2012-10-17 ENCOUNTER — Ambulatory Visit (HOSPITAL_COMMUNITY)
Admission: RE | Admit: 2012-10-17 | Discharge: 2012-10-17 | Disposition: A | Discharge status: Home / Self Care | Payer: PRIVATE HEALTH INSURANCE | Source: Ambulatory Visit | Attending: Gastroenterology | Admitting: Gastroenterology

## 2012-10-17 ENCOUNTER — Encounter (HOSPITAL_COMMUNITY): Payer: Self-pay | Admitting: *Deleted

## 2012-10-17 DIAGNOSIS — Z83719 Family history of colon polyps, unspecified: Secondary | ICD-10-CM | POA: Insufficient documentation

## 2012-10-17 DIAGNOSIS — Z860100 Personal history of colon polyps, unspecified: Secondary | ICD-10-CM | POA: Insufficient documentation

## 2012-10-17 DIAGNOSIS — F17200 Nicotine dependence, unspecified, uncomplicated: Secondary | ICD-10-CM | POA: Insufficient documentation

## 2012-10-17 DIAGNOSIS — I10 Essential (primary) hypertension: Secondary | ICD-10-CM | POA: Insufficient documentation

## 2012-10-17 DIAGNOSIS — K644 Residual hemorrhoidal skin tags: Secondary | ICD-10-CM | POA: Insufficient documentation

## 2012-10-17 DIAGNOSIS — K648 Other hemorrhoids: Secondary | ICD-10-CM | POA: Insufficient documentation

## 2012-10-17 DIAGNOSIS — Z1211 Encounter for screening for malignant neoplasm of colon: Secondary | ICD-10-CM | POA: Insufficient documentation

## 2012-10-17 DIAGNOSIS — Z800 Family history of malignant neoplasm of digestive organs: Secondary | ICD-10-CM | POA: Insufficient documentation

## 2012-10-17 DIAGNOSIS — D126 Benign neoplasm of colon, unspecified: Secondary | ICD-10-CM | POA: Insufficient documentation

## 2012-10-17 DIAGNOSIS — Z8371 Family history of colonic polyps: Secondary | ICD-10-CM | POA: Insufficient documentation

## 2012-10-17 HISTORY — DX: Essential (primary) hypertension: I10

## 2012-10-17 HISTORY — PX: HOT HEMOSTASIS: SHX5433

## 2012-10-17 HISTORY — PX: COLONOSCOPY WITH PROPOFOL: SHX5780

## 2012-10-17 SURGERY — COLONOSCOPY WITH PROPOFOL
Anesthesia: Monitor Anesthesia Care

## 2012-10-17 MED ORDER — FENTANYL CITRATE 0.05 MG/ML IJ SOLN
INTRAMUSCULAR | Status: DC | PRN
  Administered 2012-10-17: 100 ug via INTRAVENOUS

## 2012-10-17 MED ORDER — LACTATED RINGERS IV SOLN
INTRAVENOUS | Status: DC
Start: 2012-10-17 — End: 2012-10-17

## 2012-10-17 MED ORDER — PROPOFOL INFUSION 10 MG/ML OPTIME
INTRAVENOUS | Status: DC | PRN
  Administered 2012-10-17: 160 ug/kg/min via INTRAVENOUS

## 2012-10-17 MED ORDER — KETAMINE HCL 10 MG/ML IJ SOLN
INTRAMUSCULAR | Status: DC | PRN
  Administered 2012-10-17: 10 mg via INTRAVENOUS

## 2012-10-17 MED ORDER — PROMETHAZINE HCL 25 MG/ML IJ SOLN: 6.2500 mg | INTRAMUSCULAR | Status: DC | PRN

## 2012-10-17 MED ORDER — SODIUM CHLORIDE 0.9 % IV SOLN
INTRAVENOUS | Status: DC
  Administered 2012-10-17: 1000 mL via INTRAVENOUS

## 2012-10-17 MED ORDER — MIDAZOLAM HCL 5 MG/5ML IJ SOLN
INTRAMUSCULAR | Status: DC | PRN
  Administered 2012-10-17 (×2): 1 mg via INTRAVENOUS

## 2012-10-17 MED ORDER — MEPERIDINE HCL 100 MG/ML IJ SOLN: 6.2500 mg | INTRAMUSCULAR | Status: DC | PRN

## 2012-10-17 MED ORDER — SODIUM CHLORIDE 0.9 % IV SOLN
INTRAVENOUS | Status: DC
Start: 2012-10-17 — End: 2012-10-17
  Administered 2012-10-17: 07:00:00 via INTRAVENOUS

## 2012-10-17 MED ORDER — LIDOCAINE HCL 1 % IJ SOLN
INTRAMUSCULAR | Status: DC | PRN
  Administered 2012-10-17: 60 mg via INTRADERMAL

## 2012-10-17 SURGICAL SUPPLY — 22 items

## 2012-10-17 NOTE — Op Note (Signed)
[facility] [address] [place], [address]   COLONOSCOPY PROCEDURE REPORT  PATIENT: Jaime Castillo, Jaime Castillo  MR#: [id number] BIRTHDATE: May 11, 1950 , 62  yrs. old GENDER: Male ENDOSCOPIST: Jaime Rigger, MD REFERRED QI:ONGEXB Karsten Ro, M.D. PROCEDURE DATE:  10/17/2012 PROCEDURE:   Colonoscopy with biopsy ASA CLASS:   Class II INDICATIONS:Patient's personal history of adenomatous colon polyps and Patient's immediate family history of colon cancer. MEDICATIONS: propofol (Diprivan) 200mg  IV, Fentanyl 100 mcg IV, and Versed 2 mg IV  DESCRIPTION OF PROCEDURE:   After the risks benefits and alternatives of the procedure were thoroughly explained, informed consent was obtained.  The Pentax Ped Colon [id number]  endoscope was introduced through the anus and advanced to the cecum, which was identified by both the appendix and ileocecal valve , limited by No adverse events experienced. no obvious abnormality was seen on insertion and to advance to the cecum did require abdominal pressure but no position change  The quality of the prep was adequate. .  The instrument was then slowly withdrawn as the colon was fully examined.the findings are recorded below but specifically there was no signs of residual cecal polyp and the scope was slowly withdrawn and the patient tolerated the procedure well there was no obvious immediate complication       FINDINGS:  1 internal/external small hemorrhoids with anal papillae 2 questionable distal sigmoid polyp status post cold biopsy 3. Otherwise within normal limits to the cecum without obvious residual cecal polyp seen  COMPLICATIONS: none  IMPRESSION:  above  RECOMMENDATIONS: await pathology probable recheck in 4-5 years using propofol happy to  see back sooner when necessary   _______________________________ eSigned:  Vida Rigger, MD 10/17/2012 8:32 AM   [id number] Karsten Ro, MD

## 2012-10-17 NOTE — Transfer of Care (Signed)
Immediate Anesthesia Transfer of Care Note  Patient: Jaime Castillo  Procedure(s) Performed: Procedure(s): COLONOSCOPY WITH PROPOFOL (N/A) HOT HEMOSTASIS (ARGON PLASMA COAGULATION/BICAP) (N/A)  Patient Location: PACU and Endoscopy Unit  Anesthesia Type:MAC  Level of Consciousness: oriented, sedated and patient cooperative  Airway & Oxygen Therapy: Patient Spontanous Breathing and Patient connected to face mask oxygen  Post-op Assessment: Report given to PACU RN, Post -op Vital signs reviewed and stable and Patient moving all extremities  Post vital signs: Reviewed and stable  Complications: No apparent anesthesia complications

## 2012-10-17 NOTE — Anesthesia Postprocedure Evaluation (Signed)
  Anesthesia Post-op Note  Patient: Jaime Castillo  Procedure(s) Performed: Procedure(s) (LRB): COLONOSCOPY WITH PROPOFOL (N/A) HOT HEMOSTASIS (ARGON PLASMA COAGULATION/BICAP) (N/A)  Patient Location: PACU  Anesthesia Type: MAC  Level of Consciousness: awake and alert   Airway and Oxygen Therapy: Patient Spontanous Breathing  Post-op Pain: mild  Post-op Assessment: Post-op Vital signs reviewed, Patient's Cardiovascular Status Stable, Respiratory Function Stable, Patent Airway and No signs of Nausea or vomiting  Last Vitals:  Filed Vitals:   10/17/12 0858  BP: 122/79  Temp:   Resp: 18    Post-op Vital Signs: stable   Complications: No apparent anesthesia complications

## 2012-10-17 NOTE — Progress Notes (Signed)
Jaime Castillo 7:42 AM  Subjective: Patient without any GI problems and no major medical issues since I seen him in the office  Objective: Vital signs stable afebrile no acute distress exam please see pre-assessment evaluation  Assessment:  colon polyps family history of colon polyps overdue for colonic screening  Plan: Okay to proceed with anesthesia and colonoscopy today  [facility]

## 2012-10-17 NOTE — Anesthesia Preprocedure Evaluation (Signed)
Anesthesia Evaluation  Patient identified by MRN, date of birth, ID band Patient awake    Reviewed: Allergy & Precautions, H&P , NPO status , Patient's Chart, lab work & pertinent test results  Airway Mallampati: II TM Distance: >3 FB Neck ROM: Full    Dental no notable dental hx.    Pulmonary neg pulmonary ROS, Current Smoker,  breath sounds clear to auscultation  Pulmonary exam normal       Cardiovascular hypertension, Pt. on medications negative cardio ROS  Rhythm:Regular Rate:Normal     Neuro/Psych negative neurological ROS  negative psych ROS   GI/Hepatic negative GI ROS, Neg liver ROS,   Endo/Other  negative endocrine ROS  Renal/GU negative Renal ROS  negative genitourinary   Musculoskeletal negative musculoskeletal ROS (+)   Abdominal   Peds negative pediatric ROS (+)  Hematology negative hematology ROS (+)   Anesthesia Other Findings   Reproductive/Obstetrics negative OB ROS                           Anesthesia Physical Anesthesia Plan  ASA: II  Anesthesia Plan: MAC   Post-op Pain Management:    Induction:   Airway Management Planned: Simple Face Mask  Additional Equipment:   Intra-op Plan:   Post-operative Plan:   Informed Consent: I have reviewed the patients History and Physical, chart, labs and discussed the procedure including the risks, benefits and alternatives for the proposed anesthesia with the patient or authorized representative who has indicated his/her understanding and acceptance.   Dental advisory given  Plan Discussed with: CRNA  Anesthesia Plan Comments:         Anesthesia Quick Evaluation

## 2012-10-18 ENCOUNTER — Encounter (HOSPITAL_COMMUNITY): Payer: Self-pay | Admitting: Gastroenterology

## 2012-10-18 LAB — HM COLONOSCOPY

## 2013-08-09 ENCOUNTER — Ambulatory Visit (INDEPENDENT_AMBULATORY_CARE_PROVIDER_SITE_OTHER): Payer: PRIVATE HEALTH INSURANCE | Admitting: Internal Medicine

## 2013-08-09 ENCOUNTER — Encounter: Payer: Self-pay | Admitting: Internal Medicine

## 2013-08-09 ENCOUNTER — Other Ambulatory Visit (INDEPENDENT_AMBULATORY_CARE_PROVIDER_SITE_OTHER): Payer: PRIVATE HEALTH INSURANCE

## 2013-08-09 VITALS — BP 120/78 | HR 71 | Temp 98.8°F | Resp 16 | Ht 68.0 in | Wt 198.2 lb

## 2013-08-09 DIAGNOSIS — R7309 Other abnormal glucose: Secondary | ICD-10-CM

## 2013-08-09 DIAGNOSIS — R739 Hyperglycemia, unspecified: Secondary | ICD-10-CM | POA: Insufficient documentation

## 2013-08-09 DIAGNOSIS — I10 Essential (primary) hypertension: Secondary | ICD-10-CM

## 2013-08-09 DIAGNOSIS — J309 Allergic rhinitis, unspecified: Secondary | ICD-10-CM

## 2013-08-09 LAB — HEMOGLOBIN A1C: Hgb A1c MFr Bld: 6.1 % (ref 4.6–6.5)

## 2013-08-09 LAB — BASIC METABOLIC PANEL
BUN: 10 mg/dL (ref 6–23)
CO2: 29 mEq/L (ref 19–32)
Calcium: 9.4 mg/dL (ref 8.4–10.5)
Chloride: 99 mEq/L (ref 96–112)
Creatinine, Ser: 0.9 mg/dL (ref 0.4–1.5)
GFR: 87.82 mL/min (ref >60.00)
Glucose, Bld: 111 mg/dL — ABNORMAL HIGH (ref 70–99)
Potassium: 3.9 mEq/L (ref 3.5–5.1)
Sodium: 136 mEq/L (ref 135–145)

## 2013-08-09 MED ORDER — CETIRIZINE HCL 10 MG PO TABS: 10.0000 mg | ORAL_TABLET | Freq: Every day | ORAL | Status: DC

## 2013-08-09 MED ORDER — OLMESARTAN MEDOXOMIL 20 MG PO TABS: 20.0000 mg | ORAL_TABLET | Freq: Every morning | ORAL | Status: DC

## 2013-08-09 NOTE — Progress Notes (Signed)
Subjective:    Patient ID: Jaime Castillo, male    DOB: Dec 06, 1950, 64 y.o.   MRN: [id number]  Hypertension This is a chronic problem. The current episode started more than 1 year ago. The problem is unchanged. The problem is controlled. Pertinent negatives include no anxiety, blurred vision, chest pain, headaches, malaise/fatigue, neck pain, orthopnea, palpitations, peripheral edema, PND, shortness of breath or sweats. There are no associated agents to hypertension. Risk factors for coronary artery disease include smoking/tobacco exposure. Past treatments include angiotensin blockers. The current treatment provides moderate improvement. Compliance problems include diet and exercise.       Review of Systems  Constitutional: Negative.  Negative for fever, chills, malaise/fatigue, diaphoresis, appetite change and fatigue.  HENT: Positive for postnasal drip, rhinorrhea and sneezing. Negative for sinus pressure, sore throat, trouble swallowing and voice change.   Eyes: Negative.  Negative for blurred vision.  Respiratory: Negative.  Negative for cough, choking, chest tightness, shortness of breath and stridor.   Cardiovascular: Negative.  Negative for chest pain, palpitations, orthopnea, leg swelling and PND.  Gastrointestinal: Negative.  Negative for nausea, vomiting, abdominal pain, diarrhea, constipation and blood in stool.  Endocrine: Negative.  Negative for polydipsia, polyphagia and polyuria.  Genitourinary: Negative.   Musculoskeletal: Negative.  Negative for neck pain.  Skin: Negative.   Allergic/Immunologic: Negative.   Neurological: Negative.  Negative for dizziness, tremors, facial asymmetry, weakness, light-headedness, numbness and headaches.  Hematological: Negative.  Negative for adenopathy. Does not bruise/bleed easily.  Psychiatric/Behavioral: Negative.        Objective:   Physical Exam  Vitals reviewed. Constitutional: He is oriented to person, place, and time. He  appears well-developed and well-nourished. No distress.  HENT:  Head: Normocephalic and atraumatic.  Nose: Mucosal edema and rhinorrhea present. No sinus tenderness or nasal septal hematoma. No epistaxis. Right sinus exhibits no maxillary sinus tenderness and no frontal sinus tenderness. Left sinus exhibits no maxillary sinus tenderness and no frontal sinus tenderness.  Mouth/Throat: Oropharynx is clear and moist and mucous membranes are normal. Mucous membranes are not pale, not dry and not cyanotic. No oral lesions. No trismus in the jaw. No uvula swelling.  Eyes: Conjunctivae are normal. Right eye exhibits no discharge. Left eye exhibits no discharge. No scleral icterus.  Neck: Normal range of motion. Neck supple. No JVD present. No tracheal deviation present. No thyromegaly present.  Cardiovascular: Normal rate, regular rhythm, normal heart sounds and intact distal pulses.  Exam reveals no gallop and no friction rub.   No murmur heard. Pulmonary/Chest: Effort normal and breath sounds normal. No stridor. No respiratory distress. He has no wheezes. He has no rales. He exhibits no tenderness.  Abdominal: Soft. Bowel sounds are normal. He exhibits no distension and no mass. There is no tenderness. There is no rebound and no guarding.  Musculoskeletal: Normal range of motion. He exhibits no edema and no tenderness.  Lymphadenopathy:    He has no cervical adenopathy.  Neurological: He is oriented to person, place, and time.  Skin: Skin is warm and dry. No rash noted. He is not diaphoretic. No erythema. No pallor.  Psychiatric: He has a normal mood and affect. His behavior is normal. Judgment and thought content normal.     Lab Results  Component Value Date   WBC 7.4 08/08/2012   HGB 15.3 08/08/2012   HCT 44.5 08/08/2012   PLT 153.0 08/08/2012   GLUCOSE 105* 08/08/2012   CHOL 161 08/08/2012   TRIG 151.0* 08/08/2012   HDL  31.50* 08/08/2012   LDLCALC 99 08/08/2012   ALT 22 08/08/2012   AST 22 08/08/2012   NA  138 08/08/2012   K 4.6 08/08/2012   CL 101 08/08/2012   CREATININE 1.0 08/08/2012   BUN 13 08/08/2012   CO2 29 08/08/2012   TSH 0.72 08/08/2012   PSA 0.40 08/08/2012       Assessment & Plan:

## 2013-08-09 NOTE — Patient Instructions (Signed)

## 2013-08-09 NOTE — Assessment & Plan Note (Signed)
I will check his A1C to see if he has developed DM2 

## 2013-08-09 NOTE — Assessment & Plan Note (Signed)
His BP is well controlled I will check his lytes and renal function today 

## 2013-08-09 NOTE — Assessment & Plan Note (Signed)
Will treat with zyrtec

## 2013-08-13 ENCOUNTER — Telehealth: Payer: Self-pay | Admitting: Internal Medicine

## 2013-08-13 NOTE — Telephone Encounter (Signed)
Relevant patient education assigned to patient using Emmi. ° °

## 2014-02-08 IMAGING — CR DG TIBIA/FIBULA 2V*L*
4 series · 4 of 4 positions shown · non-contrast
Comparison: None.

CLINICAL DATA: Pain in the distal portion of the left lower leg.
No known injury. Beeping of metal detector.

LEFT TIBIA AND FIBULA - 2 VIEW

[view not recorded (1 of 4)]
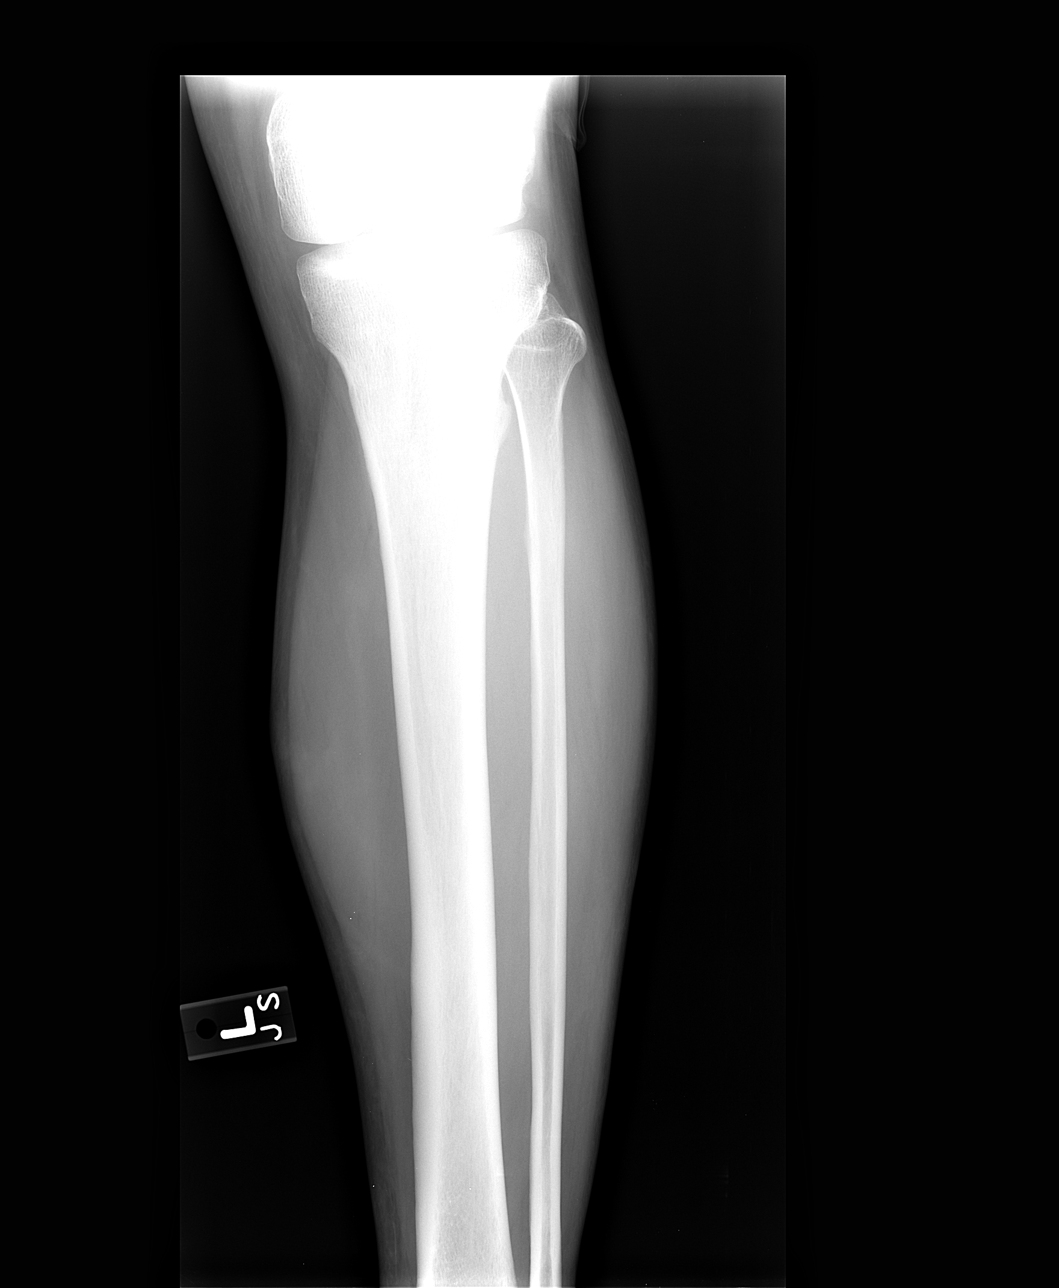

[view not recorded (2 of 4)]
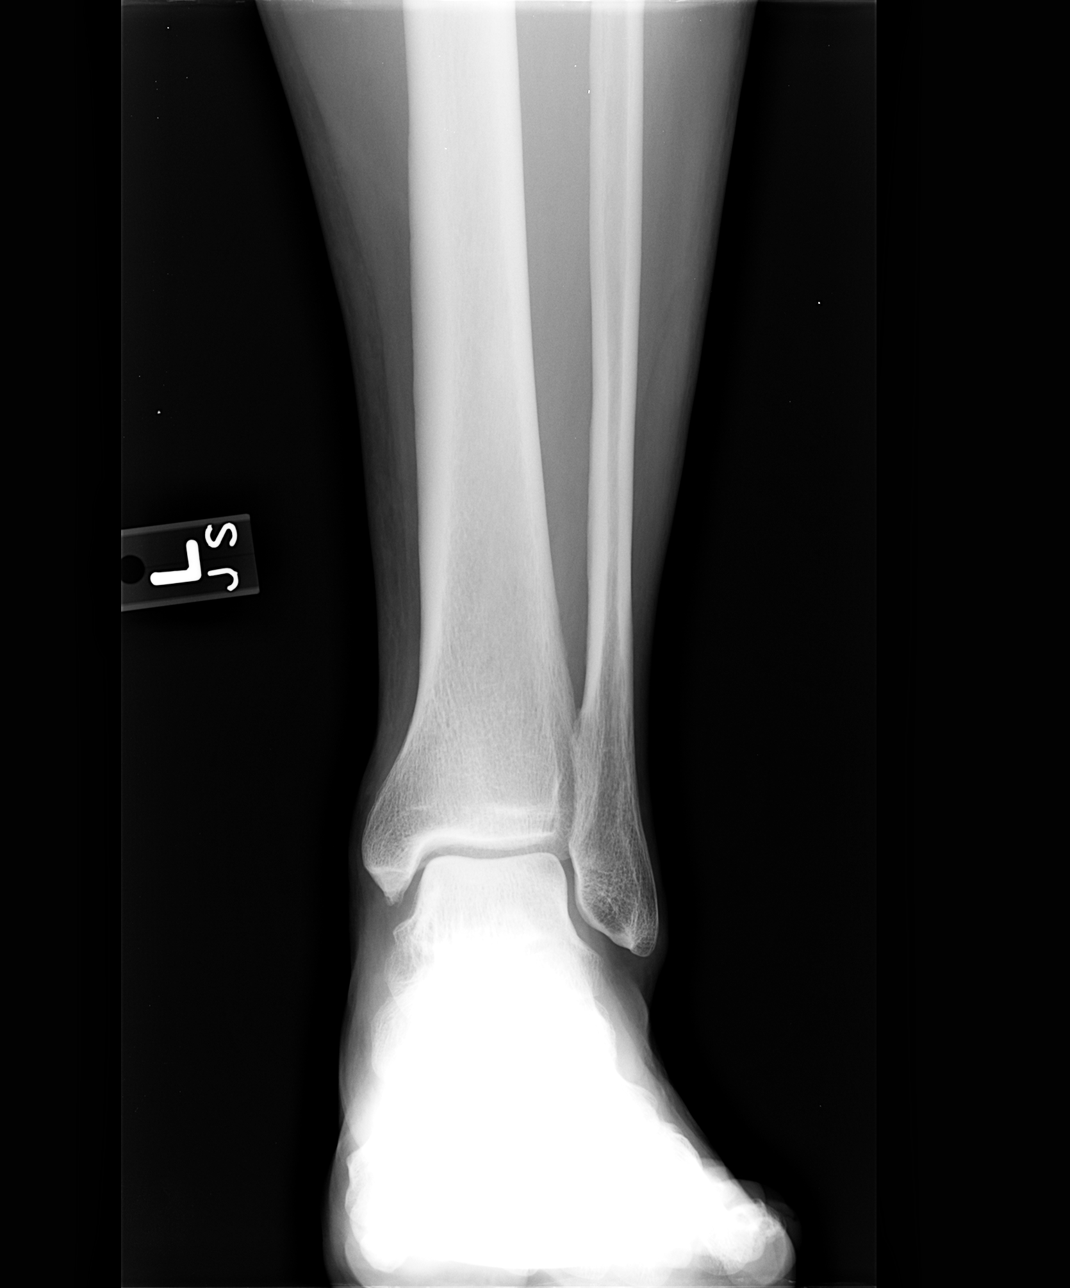

[view not recorded (3 of 4)]
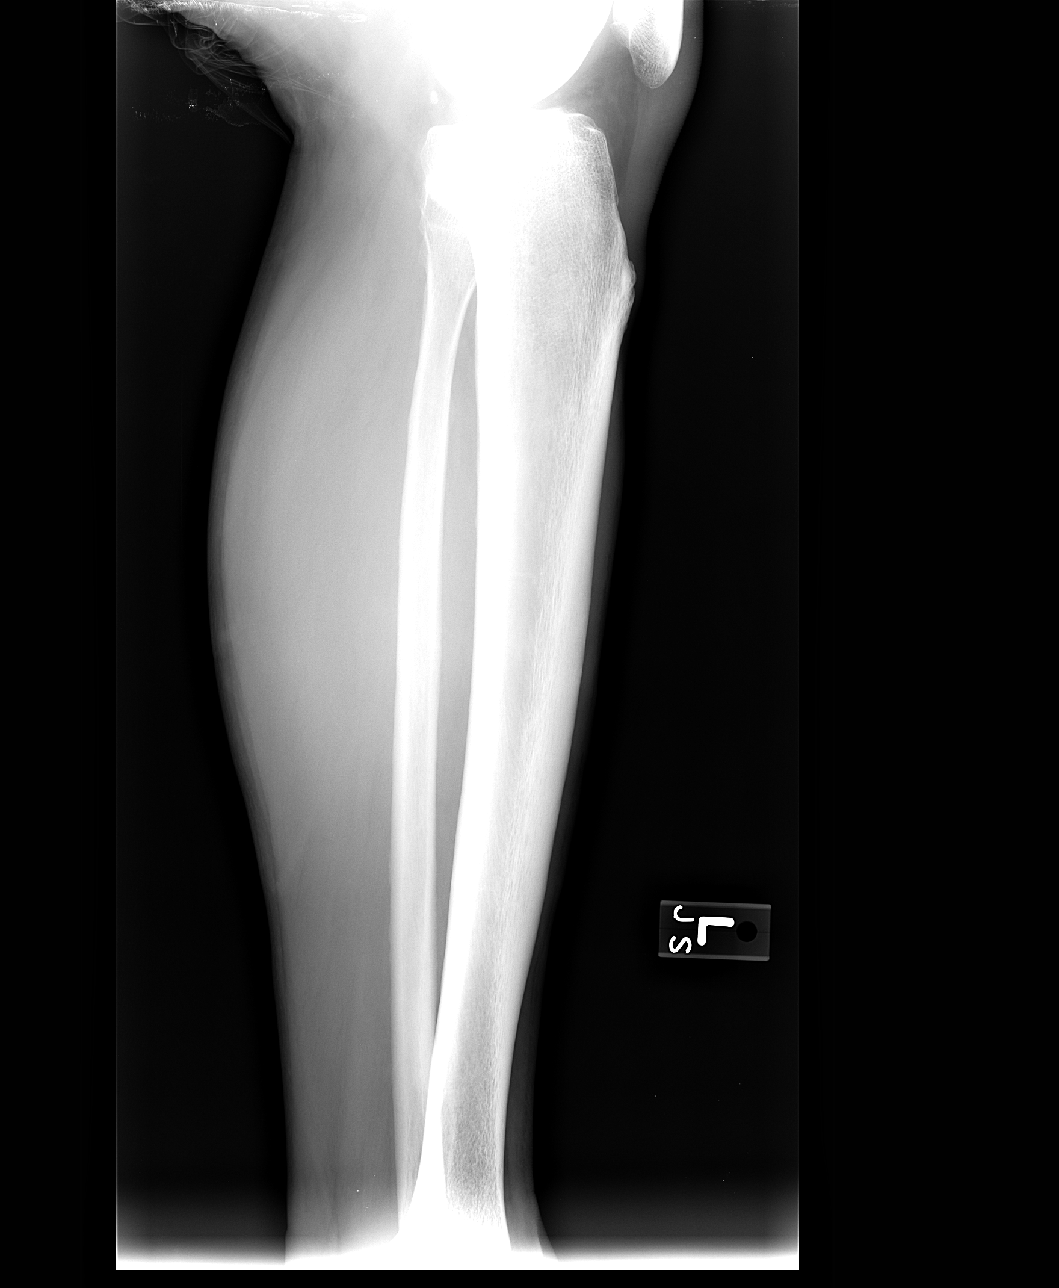

[view not recorded (4 of 4)]
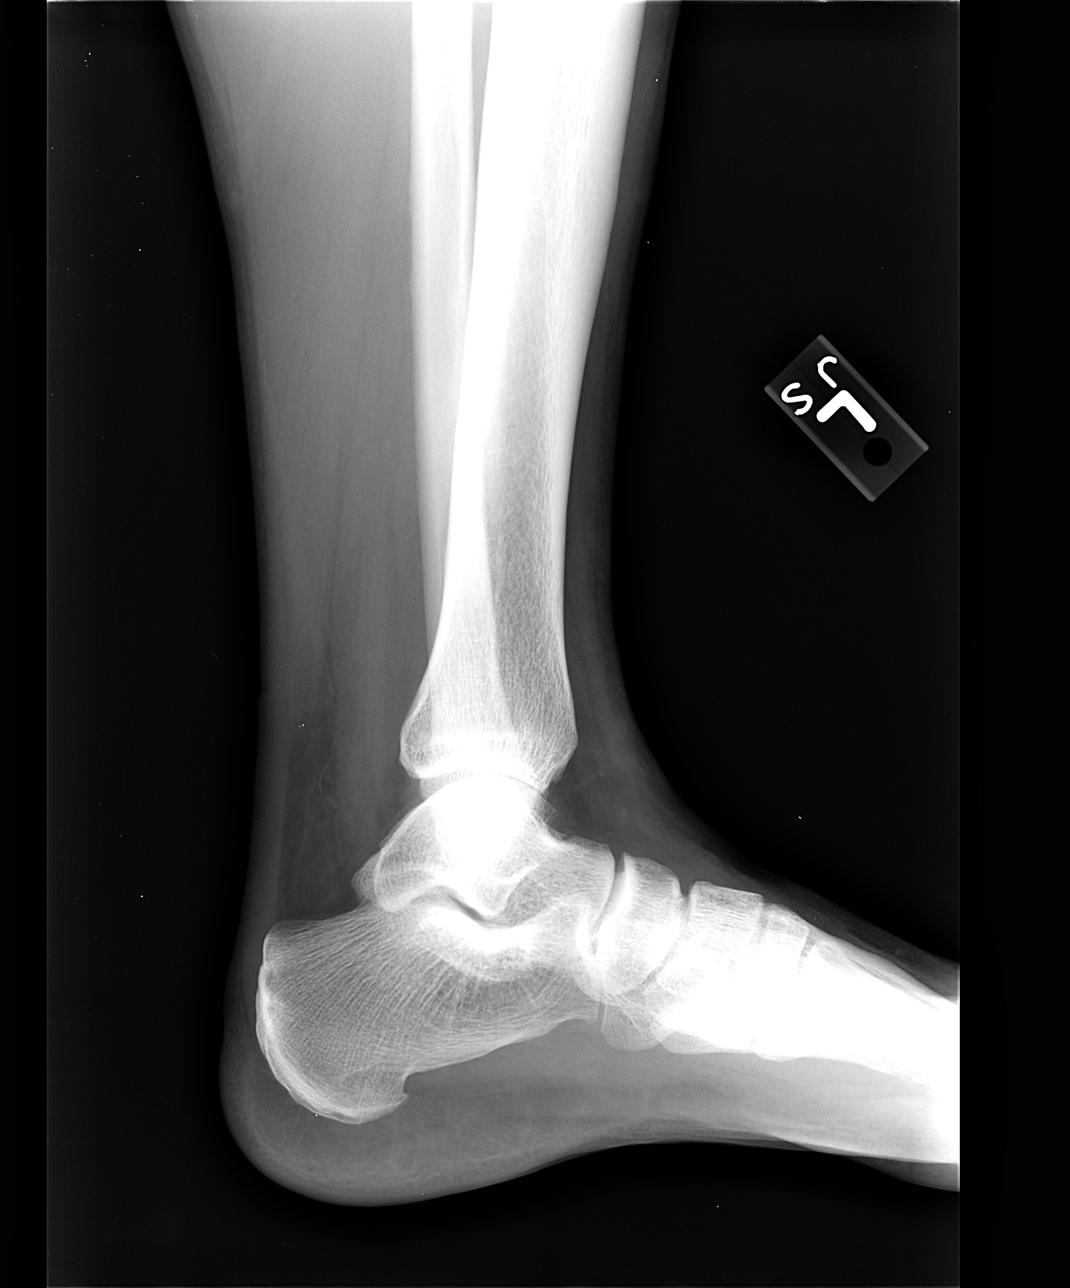

[4 of 4 positions shown; findings below may reference images not displayed]

FINDINGS: Alignment is normal.  Joint spaces are preserved.  No
fracture or dislocation is evident.  No soft tissue lesions are
seen. There is a small plantar calcaneal spur.
IMPRESSION: No fracture or bony destruction is seen.  No opaque metallic
density foreign body is evident.  There is a small plantar
calcaneal spur.

## 2014-07-31 ENCOUNTER — Ambulatory Visit (INDEPENDENT_AMBULATORY_CARE_PROVIDER_SITE_OTHER): Payer: PRIVATE HEALTH INSURANCE | Admitting: Internal Medicine

## 2014-07-31 ENCOUNTER — Other Ambulatory Visit: Payer: Self-pay | Admitting: Internal Medicine

## 2014-07-31 ENCOUNTER — Other Ambulatory Visit (INDEPENDENT_AMBULATORY_CARE_PROVIDER_SITE_OTHER): Payer: PRIVATE HEALTH INSURANCE

## 2014-07-31 ENCOUNTER — Encounter: Payer: Self-pay | Admitting: Internal Medicine

## 2014-07-31 VITALS — BP 128/76 | HR 76 | Temp 98.7°F | Resp 16 | Ht 68.0 in | Wt 193.0 lb

## 2014-07-31 DIAGNOSIS — R739 Hyperglycemia, unspecified: Secondary | ICD-10-CM | POA: Diagnosis not present

## 2014-07-31 DIAGNOSIS — R7989 Other specified abnormal findings of blood chemistry: Secondary | ICD-10-CM

## 2014-07-31 DIAGNOSIS — Z0000 Encounter for general adult medical examination without abnormal findings: Secondary | ICD-10-CM

## 2014-07-31 DIAGNOSIS — I10 Essential (primary) hypertension: Secondary | ICD-10-CM | POA: Diagnosis not present

## 2014-07-31 LAB — LIPID PANEL
Cholesterol: 144 mg/dL (ref 0–200)
HDL: 30.4 mg/dL — ABNORMAL LOW (ref >39.00)
NonHDL: 113.6
Total CHOL/HDL Ratio: 5
Triglycerides: 224 mg/dL — ABNORMAL HIGH (ref 0.0–149.0)
VLDL: 44.8 mg/dL — ABNORMAL HIGH (ref 0.0–40.0)

## 2014-07-31 LAB — CBC WITH DIFFERENTIAL/PLATELET
Basophils Absolute: 0 10*3/uL (ref 0.0–0.1)
Basophils Relative: 0.2 % (ref 0.0–3.0)
Eosinophils Absolute: 0.1 10*3/uL (ref 0.0–0.7)
Eosinophils Relative: 1.8 % (ref 0.0–5.0)
HCT: 42.8 % (ref 39.0–52.0)
Hemoglobin: 14.5 g/dL (ref 13.0–17.0)
Lymphocytes Relative: 13.3 % (ref 12.0–46.0)
Lymphs Abs: 1 10*3/uL (ref 0.7–4.0)
MCHC: 34 g/dL (ref 30.0–36.0)
MCV: 93.3 fl (ref 78.0–100.0)
Monocytes Absolute: 0.4 10*3/uL (ref 0.1–1.0)
Monocytes Relative: 5.4 % (ref 3.0–12.0)
Neutro Abs: 6.2 10*3/uL (ref 1.4–7.7)
Neutrophils Relative %: 79.3 % — ABNORMAL HIGH (ref 43.0–77.0)
Platelets: 134 10*3/uL — ABNORMAL LOW (ref 150.0–400.0)
RBC: 4.59 Mil/uL (ref 4.22–5.81)
RDW: 14.5 % (ref 11.5–15.5)
WBC: 7.8 10*3/uL (ref 4.0–10.5)

## 2014-07-31 LAB — COMPREHENSIVE METABOLIC PANEL
ALT: 18 U/L (ref 0–53)
AST: 19 U/L (ref 0–37)
Albumin: 4.4 g/dL (ref 3.5–5.2)
Alkaline Phosphatase: 80 U/L (ref 39–117)
BUN: 13 mg/dL (ref 6–23)
CO2: 32 mEq/L (ref 19–32)
Calcium: 9.4 mg/dL (ref 8.4–10.5)
Chloride: 101 mEq/L (ref 96–112)
Creatinine, Ser: 0.98 mg/dL (ref 0.40–1.50)
GFR: 82.41 mL/min (ref >60.00)
Glucose, Bld: 96 mg/dL (ref 70–99)
Potassium: 4.3 mEq/L (ref 3.5–5.1)
Sodium: 138 mEq/L (ref 135–145)
Total Bilirubin: 0.5 mg/dL (ref 0.2–1.2)
Total Protein: 6.7 g/dL (ref 6.0–8.3)

## 2014-07-31 LAB — TSH: TSH: 0.38 u[IU]/mL (ref 0.35–4.50)

## 2014-07-31 LAB — PSA: PSA: 0.42 ng/mL (ref 0.10–4.00)

## 2014-07-31 LAB — HEMOGLOBIN A1C: Hgb A1c MFr Bld: 6.1 % (ref 4.6–6.5)

## 2014-07-31 LAB — LDL CHOLESTEROL, DIRECT: Direct LDL: 97 mg/dL

## 2014-07-31 NOTE — Patient Instructions (Signed)

## 2014-07-31 NOTE — Progress Notes (Signed)
Subjective:    Patient ID: Jaime Castillo, male    DOB: 16-Jul-1950, 64 y.o.   MRN: [id number]  Hypertension This is a chronic problem. The current episode started more than 1 year ago. The problem has been gradually improving since onset. The problem is controlled. Pertinent negatives include no anxiety, blurred vision, chest pain, headaches, malaise/fatigue, neck pain, orthopnea, palpitations, peripheral edema, PND, shortness of breath or sweats. There are no associated agents to hypertension. Risk factors for coronary artery disease include smoking/tobacco exposure. Past treatments include angiotensin blockers. The current treatment provides moderate improvement. Compliance problems include diet and exercise.       Review of Systems  Constitutional: Negative.  Negative for fever, chills, malaise/fatigue, diaphoresis, appetite change and fatigue.  HENT: Negative.   Eyes: Negative.  Negative for blurred vision.  Respiratory: Negative.  Negative for cough, choking, chest tightness, shortness of breath and stridor.   Cardiovascular: Negative.  Negative for chest pain, palpitations, orthopnea, leg swelling and PND.  Gastrointestinal: Negative.  Negative for nausea, vomiting, abdominal pain, diarrhea, constipation and blood in stool.  Endocrine: Negative.   Genitourinary: Negative.  Negative for dysuria, urgency, frequency, hematuria, decreased urine volume and difficulty urinating.  Musculoskeletal: Negative.  Negative for back pain, arthralgias and neck pain.  Skin: Negative.   Allergic/Immunologic: Negative.   Neurological: Negative.  Negative for dizziness and headaches.  Hematological: Negative.  Negative for adenopathy. Does not bruise/bleed easily.  Psychiatric/Behavioral: Negative.        Objective:   Physical Exam  Constitutional: He is oriented to person, place, and time. He appears well-developed and well-nourished. No distress.  HENT:  Head: Normocephalic and atraumatic.    Mouth/Throat: Oropharynx is clear and moist. No oropharyngeal exudate.  Eyes: Conjunctivae are normal. Right eye exhibits no discharge. Left eye exhibits no discharge. No scleral icterus.  Neck: Normal range of motion. Neck supple. No JVD present. No tracheal deviation present. No thyromegaly present.  Cardiovascular: Normal rate, regular rhythm, normal heart sounds and intact distal pulses.  Exam reveals no gallop and no friction rub.   No murmur heard. Pulmonary/Chest: Effort normal and breath sounds normal. No stridor. No respiratory distress. He has no wheezes. He has no rales. He exhibits no tenderness.  Abdominal: Soft. Bowel sounds are normal. He exhibits no distension and no mass. There is no tenderness. There is no rebound and no guarding. Hernia confirmed negative in the right inguinal area and confirmed negative in the left inguinal area.  Genitourinary: Testes normal and penis normal. Rectal exam shows external hemorrhoid and internal hemorrhoid. Rectal exam shows no fissure, no mass, no tenderness and anal tone normal. Guaiac negative stool. Prostate is enlarged (1+ smooth symm BPH). Prostate is not tender. Right testis shows no mass, no swelling and no tenderness. Right testis is descended. Left testis shows no mass, no swelling and no tenderness. Left testis is descended. Circumcised. No penile erythema or penile tenderness. No discharge found.  Musculoskeletal: Normal range of motion. He exhibits no edema or tenderness.  Lymphadenopathy:    He has no cervical adenopathy.       Right: No inguinal adenopathy present.       Left: No inguinal adenopathy present.  Neurological: He is oriented to person, place, and time.  Skin: Skin is warm and dry. No rash noted. He is not diaphoretic. No erythema. No pallor.  Psychiatric: He has a normal mood and affect. His behavior is normal. Judgment and thought content normal.  Nursing note and  vitals reviewed.    Lab Results  Component Value  Date   WBC 7.4 08/08/2012   HGB 15.3 08/08/2012   HCT 44.5 08/08/2012   PLT 153.0 08/08/2012   GLUCOSE 111* 08/09/2013   CHOL 161 08/08/2012   TRIG 151.0* 08/08/2012   HDL 31.50* 08/08/2012   LDLCALC 99 08/08/2012   ALT 22 08/08/2012   AST 22 08/08/2012   NA 136 08/09/2013   K 3.9 08/09/2013   CL 99 08/09/2013   CREATININE 0.9 08/09/2013   BUN 10 08/09/2013   CO2 29 08/09/2013   TSH 0.72 08/08/2012   PSA 0.40 08/08/2012   HGBA1C 6.1 08/09/2013       Assessment & Plan:

## 2014-07-31 NOTE — Progress Notes (Signed)
Pre visit review using our clinic review tool, if applicable. No additional management support is needed unless otherwise documented below in the visit note. 

## 2014-08-01 ENCOUNTER — Encounter: Payer: Self-pay | Admitting: Internal Medicine

## 2014-08-01 LAB — FECAL OCCULT BLOOD, GUAIAC: Fecal Occult Blood: NEGATIVE

## 2014-08-01 NOTE — Assessment & Plan Note (Signed)
Vaccines were reviewed and updated Exam done Labs ordered Pt ed material was given 

## 2014-08-01 NOTE — Assessment & Plan Note (Signed)
His BP is well controlled Lytes and renal function as stable He agrees to work on his lifestyle modifications

## 2014-08-01 NOTE — Assessment & Plan Note (Signed)
Her has pre-diabetes His A1C is unchanged He will work on his lifestyle modifications

## 2014-08-10 ENCOUNTER — Other Ambulatory Visit: Payer: Self-pay | Admitting: Internal Medicine

## 2015-06-02 ENCOUNTER — Telehealth: Payer: Self-pay | Admitting: Internal Medicine

## 2015-06-02 NOTE — Telephone Encounter (Signed)
Patient has made yearly appt for march.  He is requesting benicar to be sent to [facility] at [facility].

## 2015-06-03 MED ORDER — OLMESARTAN MEDOXOMIL 20 MG PO TABS: 20.0000 mg | ORAL_TABLET | Freq: Every morning | ORAL | Status: DC

## 2015-08-04 ENCOUNTER — Encounter: Payer: Self-pay | Admitting: Internal Medicine

## 2015-08-04 ENCOUNTER — Other Ambulatory Visit (INDEPENDENT_AMBULATORY_CARE_PROVIDER_SITE_OTHER): Payer: BLUE CROSS/BLUE SHIELD

## 2015-08-04 ENCOUNTER — Ambulatory Visit (INDEPENDENT_AMBULATORY_CARE_PROVIDER_SITE_OTHER): Payer: BLUE CROSS/BLUE SHIELD | Admitting: Internal Medicine

## 2015-08-04 VITALS — BP 124/82 | HR 65 | Temp 98.9°F | Resp 16 | Ht 68.0 in | Wt 188.0 lb

## 2015-08-04 DIAGNOSIS — R739 Hyperglycemia, unspecified: Secondary | ICD-10-CM

## 2015-08-04 DIAGNOSIS — Z23 Encounter for immunization: Secondary | ICD-10-CM | POA: Diagnosis not present

## 2015-08-04 DIAGNOSIS — J301 Allergic rhinitis due to pollen: Secondary | ICD-10-CM | POA: Diagnosis not present

## 2015-08-04 DIAGNOSIS — I10 Essential (primary) hypertension: Secondary | ICD-10-CM

## 2015-08-04 DIAGNOSIS — Z0000 Encounter for general adult medical examination without abnormal findings: Secondary | ICD-10-CM | POA: Diagnosis not present

## 2015-08-04 LAB — LIPID PANEL
Cholesterol: 157 mg/dL (ref 0–200)
HDL: 32.6 mg/dL — ABNORMAL LOW (ref >39.00)
LDL Cholesterol: 87 mg/dL (ref 0–99)
NonHDL: 124.5
Total CHOL/HDL Ratio: 5
Triglycerides: 189 mg/dL — ABNORMAL HIGH (ref 0.0–149.0)
VLDL: 37.8 mg/dL (ref 0.0–40.0)

## 2015-08-04 LAB — COMPREHENSIVE METABOLIC PANEL
ALT: 15 U/L (ref 0–53)
AST: 18 U/L (ref 0–37)
Albumin: 4.5 g/dL (ref 3.5–5.2)
Alkaline Phosphatase: 72 U/L (ref 39–117)
BUN: 13 mg/dL (ref 6–23)
CO2: 28 mEq/L (ref 19–32)
Calcium: 9.5 mg/dL (ref 8.4–10.5)
Chloride: 102 mEq/L (ref 96–112)
Creatinine, Ser: 0.95 mg/dL (ref 0.40–1.50)
GFR: 85.14 mL/min (ref >60.00)
Glucose, Bld: 104 mg/dL — ABNORMAL HIGH (ref 70–99)
Potassium: 4 mEq/L (ref 3.5–5.1)
Sodium: 139 mEq/L (ref 135–145)
Total Bilirubin: 0.4 mg/dL (ref 0.2–1.2)
Total Protein: 6.8 g/dL (ref 6.0–8.3)

## 2015-08-04 LAB — URINALYSIS, ROUTINE W REFLEX MICROSCOPIC
Bilirubin Urine: NEGATIVE
Hgb urine dipstick: NEGATIVE
Ketones, ur: NEGATIVE
Leukocytes, UA: NEGATIVE
Nitrite: NEGATIVE
RBC / HPF: NONE SEEN (ref 0–?)
Specific Gravity, Urine: 1.01 (ref 1.000–1.030)
Total Protein, Urine: NEGATIVE
Urine Glucose: NEGATIVE
Urobilinogen, UA: 0.2 (ref 0.0–1.0)
WBC, UA: NONE SEEN (ref 0–?)
pH: 6.5 (ref 5.0–8.0)

## 2015-08-04 LAB — CBC WITH DIFFERENTIAL/PLATELET
Basophils Absolute: 0 10*3/uL (ref 0.0–0.1)
Basophils Relative: 0.6 % (ref 0.0–3.0)
Eosinophils Absolute: 0.1 10*3/uL (ref 0.0–0.7)
Eosinophils Relative: 1 % (ref 0.0–5.0)
HCT: 42.8 % (ref 39.0–52.0)
Hemoglobin: 14.4 g/dL (ref 13.0–17.0)
Lymphocytes Relative: 28.5 % (ref 12.0–46.0)
Lymphs Abs: 1.9 10*3/uL (ref 0.7–4.0)
MCHC: 33.5 g/dL (ref 30.0–36.0)
MCV: 93.9 fl (ref 78.0–100.0)
Monocytes Absolute: 0.4 10*3/uL (ref 0.1–1.0)
Monocytes Relative: 6 % (ref 3.0–12.0)
Neutro Abs: 4.2 10*3/uL (ref 1.4–7.7)
Neutrophils Relative %: 63.9 % (ref 43.0–77.0)
Platelets: 164 10*3/uL (ref 150.0–400.0)
RBC: 4.56 Mil/uL (ref 4.22–5.81)
RDW: 14.2 % (ref 11.5–15.5)
WBC: 6.6 10*3/uL (ref 4.0–10.5)

## 2015-08-04 LAB — TSH: TSH: 0.53 u[IU]/mL (ref 0.35–4.50)

## 2015-08-04 LAB — HEMOGLOBIN A1C: Hgb A1c MFr Bld: 6.2 % (ref 4.6–6.5)

## 2015-08-04 LAB — FECAL OCCULT BLOOD, GUAIAC: Fecal Occult Blood: NEGATIVE

## 2015-08-04 LAB — PSA: PSA: 0.42 ng/mL (ref 0.10–4.00)

## 2015-08-04 MED ORDER — AZELASTINE HCL 0.1 % NA SOLN: 2.0000 | Freq: Two times a day (BID) | NASAL | Status: DC

## 2015-08-04 MED ORDER — OLMESARTAN MEDOXOMIL 20 MG PO TABS: 20.0000 mg | ORAL_TABLET | Freq: Every morning | ORAL | Status: DC

## 2015-08-04 NOTE — Progress Notes (Signed)
Pre visit review using our clinic review tool, if applicable. No additional management support is needed unless otherwise documented below in the visit note. 

## 2015-08-04 NOTE — Patient Instructions (Signed)

## 2015-08-04 NOTE — Progress Notes (Signed)
Subjective:  Patient ID: Jaime Castillo, male    DOB: Oct 19, 1950  Age: 65 y.o. MRN: [id number]  CC: Hypertension; Allergic Rhinitis ; and Annual Exam   HPI Jaime Castillo presents for a CPX.  Jaime Castillo tells me that his blood pressure has been well controlled on the ARB. Jaime Castillo denies headache, blurred vision, chest pain, shortness of breath, edema, or dyspnea on exertion.  Jaime Castillo complains of nasal congestion. runny nose and postnasal drip due to chronic allergies. Jaime Castillo has been told by his eye doctor not to use an inhaled nasal steroid due to concerns about cataracts. Jaime Castillo has tried an oral antihistamine but says it makes him feel dried out. Jaime Castillo wants to know if there are any other treatments options for his nasal symptoms.  Outpatient Prescriptions Prior to Visit  Medication Sig Dispense Refill  . Multiple Vitamin (MULTIVITAMIN WITH MINERALS) TABS Take 1 tablet by mouth daily.    Marland Kitchen olmesartan (BENICAR) 20 MG tablet Take 1 tablet (20 mg total) by mouth every morning. 90 tablet 0   No facility-administered medications prior to visit.    ROS Review of Systems  Constitutional: Negative.  Negative for fever, chills, diaphoresis, activity change, appetite change and fatigue.  HENT: Positive for congestion, postnasal drip and rhinorrhea. Negative for facial swelling, nosebleeds, sinus pressure, sneezing, sore throat, tinnitus, trouble swallowing and voice change.   Eyes: Negative.  Negative for visual disturbance.  Respiratory: Negative.  Negative for cough, choking, chest tightness, shortness of breath and stridor.   Cardiovascular: Negative.  Negative for chest pain, palpitations and leg swelling.  Gastrointestinal: Negative.  Negative for nausea, vomiting, abdominal pain, diarrhea, constipation and blood in stool.  Endocrine: Negative.   Genitourinary: Negative.  Negative for dysuria, frequency, hematuria, penile swelling, enuresis, difficulty urinating and penile pain.  Musculoskeletal: Negative.   Negative for myalgias, back pain, joint swelling and arthralgias.  Skin: Negative.  Negative for color change, pallor and rash.  Allergic/Immunologic: Negative.   Neurological: Negative.  Negative for dizziness.  Hematological: Negative.  Negative for adenopathy. Does not bruise/bleed easily.  Psychiatric/Behavioral: Negative.     Objective:  BP 124/82 mmHg  Pulse 65  Temp(Src) 98.9 F (37.2 C) (Oral)  Resp 16  Ht 5\' 8"  (1.727 m)  Wt 188 lb (85.276 kg)  BMI 28.59 kg/m2  SpO2 97%  BP Readings from Last 3 Encounters:  08/04/15 124/82  07/31/14 128/76  08/09/13 120/78    Wt Readings from Last 3 Encounters:  08/04/15 188 lb (85.276 kg)  07/31/14 193 lb (87.544 kg)  08/09/13 198 lb 4 oz (89.926 kg)    Physical Exam  Constitutional: Jaime Castillo is oriented to person, place, and time. Jaime Castillo appears well-developed and well-nourished. No distress.  HENT:  Head: Normocephalic and atraumatic.  Nose: Mucosal edema and rhinorrhea present. No sinus tenderness, nasal deformity or septal deviation. No epistaxis. Right sinus exhibits no maxillary sinus tenderness and no frontal sinus tenderness. Left sinus exhibits no maxillary sinus tenderness and no frontal sinus tenderness.  Mouth/Throat: Oropharynx is clear and moist. No oropharyngeal exudate.  Eyes: Conjunctivae are normal. Right eye exhibits no discharge. Left eye exhibits no discharge. No scleral icterus.  Neck: Normal range of motion. Neck supple. No JVD present. No tracheal deviation present. No thyromegaly present.  Cardiovascular: Normal rate, regular rhythm, normal heart sounds and intact distal pulses.  Exam reveals no gallop and no friction rub.   No murmur heard. Pulmonary/Chest: Effort normal and breath sounds normal. No stridor. No respiratory distress.  Jaime Castillo has no wheezes. Jaime Castillo has no rales. Jaime Castillo exhibits no tenderness.  Abdominal: Soft. Bowel sounds are normal. Jaime Castillo exhibits no distension and no mass. There is no tenderness. There is no  rebound and no guarding. Hernia confirmed negative in the right inguinal area and confirmed negative in the left inguinal area.  Genitourinary: Penis normal. Rectal exam shows external hemorrhoid and internal hemorrhoid. Rectal exam shows no fissure, no mass, no tenderness and anal tone normal. Guaiac negative stool. Prostate is enlarged (1+ smooth symm BPH). Prostate is not tender. Right testis shows no mass, no swelling and no tenderness. Right testis is descended. Left testis shows no mass, no swelling and no tenderness. Left testis is descended. Circumcised. No paraphimosis, penile erythema or penile tenderness. No discharge found.  Musculoskeletal: Normal range of motion. Jaime Castillo exhibits no edema or tenderness.  Lymphadenopathy:    Jaime Castillo has no cervical adenopathy.       Right: No inguinal adenopathy present.       Left: No inguinal adenopathy present.  Neurological: Jaime Castillo is oriented to person, place, and time.  Skin: Skin is warm and dry. No rash noted. Jaime Castillo is not diaphoretic. No erythema. No pallor.  Vitals reviewed.   Lab Results  Component Value Date   WBC 6.6 08/04/2015   HGB 14.4 08/04/2015   HCT 42.8 08/04/2015   PLT 164.0 08/04/2015   GLUCOSE 104* 08/04/2015   CHOL 157 08/04/2015   TRIG 189.0* 08/04/2015   HDL 32.60* 08/04/2015   LDLDIRECT 97.0 07/31/2014   LDLCALC 87 08/04/2015   ALT 15 08/04/2015   AST 18 08/04/2015   NA 139 08/04/2015   K 4.0 08/04/2015   CL 102 08/04/2015   CREATININE 0.95 08/04/2015   BUN 13 08/04/2015   CO2 28 08/04/2015   TSH 0.53 08/04/2015   PSA 0.42 08/04/2015   HGBA1C 6.2 08/04/2015    No results found.  Assessment & Plan:   Jaime Castillo was seen today for hypertension, allergic rhinitis  and annual exam.  Diagnoses and all orders for this visit:  Allergic rhinitis due to pollen- I have asked him to start using Astelin nasal spray -     azelastine (ASTELIN) 0.1 % nasal spray; Place 2 sprays into both nostrils 2 (two) times daily. Use in each  nostril as directed  Hyperglycemia- his hemoglobin A1c is 6.2%, Jaime Castillo is prediabetic, no medications are needed at this time, I have asked him to improve his lifestyle modifications with diet/exercise/weight loss. -     Hemoglobin A1c; Future  Routine general medical examination at a health care facility- vaccines were reviewed and updated, colonoscopy is up-to-date, exam completed, labs ordered and reviewed, patient education material was given. -     Lipid panel; Future -     Comprehensive metabolic panel; Future -     CBC with Differential/Platelet; Future -     TSH; Future -     Urinalysis, Routine w reflex microscopic (not at [facility]); Future -     PSA; Future  Essential hypertension, benign- his blood pressure is adequately well controlled, his electrolytes and renal function are stable, I've asked him to improve his lifestyle modifications. -     olmesartan (BENICAR) 20 MG tablet; Take 1 tablet (20 mg total) by mouth every morning.  Need for vaccination with 13-polyvalent pneumococcal conjugate vaccine -     Pneumococcal conjugate vaccine 13-valent   I am having Jaime Castillo start on azelastine. I am also having him maintain his multivitamin with minerals,  olmesartan, and clindamycin.  Meds ordered this encounter  Medications  . olmesartan (BENICAR) 20 MG tablet    Sig: Take 1 tablet (20 mg total) by mouth every morning.    Dispense:  90 tablet    Refill:  3  . clindamycin (CLEOCIN) 300 MG capsule    Sig:     Refill:  0  . azelastine (ASTELIN) 0.1 % nasal spray    Sig: Place 2 sprays into both nostrils 2 (two) times daily. Use in each nostril as directed    Dispense:  30 mL    Refill:  12     Follow-up: Return in about 6 months (around 02/04/2016).  Scarlette Calico, MD

## 2015-08-08 ENCOUNTER — Telehealth: Payer: Self-pay

## 2015-08-08 NOTE — Telephone Encounter (Signed)
Patient wanted a return call when shingles vaccine arrived in our office (we were out of shingles vaccine for about 1 week prior)---i have advised patient that shingles vaccine is now here in our office, patient wants to check with his insurance company to make sure of copay/coverage/out of pocket cost----patient will call back to schedule nurse visit when he is ready for vaccine

## 2016-01-31 ENCOUNTER — Other Ambulatory Visit: Payer: Self-pay | Admitting: Internal Medicine

## 2016-06-29 ENCOUNTER — Other Ambulatory Visit: Payer: Self-pay | Admitting: *Deleted

## 2016-06-29 MED ORDER — OLMESARTAN MEDOXOMIL 20 MG PO TABS: 20.0000 mg | ORAL_TABLET | Freq: Every morning | ORAL | 0 refills | Status: DC

## 2016-06-30 ENCOUNTER — Other Ambulatory Visit: Payer: Self-pay | Admitting: *Deleted

## 2016-06-30 MED ORDER — OLMESARTAN MEDOXOMIL 20 MG PO TABS: 20.0000 mg | ORAL_TABLET | Freq: Every morning | ORAL | 1 refills | Status: DC

## 2016-06-30 NOTE — Telephone Encounter (Signed)
Rec'd call pt states he made CPX appt for 4/3, but he will be running out of his Benicar b4 appt. Verified pharmacy inform can send enough until his appt...Jaime Castillo

## 2016-08-10 ENCOUNTER — Other Ambulatory Visit (INDEPENDENT_AMBULATORY_CARE_PROVIDER_SITE_OTHER): Payer: BLUE CROSS/BLUE SHIELD

## 2016-08-10 ENCOUNTER — Ambulatory Visit (INDEPENDENT_AMBULATORY_CARE_PROVIDER_SITE_OTHER): Payer: BLUE CROSS/BLUE SHIELD | Admitting: Internal Medicine

## 2016-08-10 ENCOUNTER — Encounter: Payer: Self-pay | Admitting: Internal Medicine

## 2016-08-10 VITALS — BP 130/78 | HR 66 | Temp 98.4°F | Resp 16 | Ht 68.0 in | Wt 191.1 lb

## 2016-08-10 DIAGNOSIS — R739 Hyperglycemia, unspecified: Secondary | ICD-10-CM

## 2016-08-10 DIAGNOSIS — I10 Essential (primary) hypertension: Secondary | ICD-10-CM | POA: Diagnosis not present

## 2016-08-10 DIAGNOSIS — Z0000 Encounter for general adult medical examination without abnormal findings: Secondary | ICD-10-CM

## 2016-08-10 DIAGNOSIS — E785 Hyperlipidemia, unspecified: Secondary | ICD-10-CM

## 2016-08-10 DIAGNOSIS — Z86010 Personal history of colonic polyps: Secondary | ICD-10-CM

## 2016-08-10 DIAGNOSIS — R7989 Other specified abnormal findings of blood chemistry: Secondary | ICD-10-CM | POA: Diagnosis not present

## 2016-08-10 LAB — CBC WITH DIFFERENTIAL/PLATELET
Basophils Absolute: 0 10*3/uL (ref 0.0–0.1)
Basophils Relative: 0.5 % (ref 0.0–3.0)
Eosinophils Absolute: 0.1 10*3/uL (ref 0.0–0.7)
Eosinophils Relative: 1.2 % (ref 0.0–5.0)
HCT: 44.9 % (ref 39.0–52.0)
Hemoglobin: 15 g/dL (ref 13.0–17.0)
Lymphocytes Relative: 28.6 % (ref 12.0–46.0)
Lymphs Abs: 1.8 10*3/uL (ref 0.7–4.0)
MCHC: 33.5 g/dL (ref 30.0–36.0)
MCV: 94.4 fl (ref 78.0–100.0)
Monocytes Absolute: 0.4 10*3/uL (ref 0.1–1.0)
Monocytes Relative: 6.3 % (ref 3.0–12.0)
Neutro Abs: 4.1 10*3/uL (ref 1.4–7.7)
Neutrophils Relative %: 63.4 % (ref 43.0–77.0)
Platelets: 141 10*3/uL — ABNORMAL LOW (ref 150.0–400.0)
RBC: 4.75 Mil/uL (ref 4.22–5.81)
RDW: 14.2 % (ref 11.5–15.5)
WBC: 6.5 10*3/uL (ref 4.0–10.5)

## 2016-08-10 LAB — URINALYSIS, ROUTINE W REFLEX MICROSCOPIC
Bilirubin Urine: NEGATIVE
Hgb urine dipstick: NEGATIVE
Ketones, ur: NEGATIVE
Leukocytes, UA: NEGATIVE
Nitrite: NEGATIVE
RBC / HPF: NONE SEEN (ref 0–?)
Specific Gravity, Urine: 1.015 (ref 1.000–1.030)
Total Protein, Urine: NEGATIVE
Urine Glucose: NEGATIVE
Urobilinogen, UA: 0.2 (ref 0.0–1.0)
WBC, UA: NONE SEEN (ref 0–?)
pH: 7 (ref 5.0–8.0)

## 2016-08-10 LAB — COMPREHENSIVE METABOLIC PANEL
ALT: 18 U/L (ref 0–53)
AST: 19 U/L (ref 0–37)
Albumin: 4.4 g/dL (ref 3.5–5.2)
Alkaline Phosphatase: 63 U/L (ref 39–117)
BUN: 9 mg/dL (ref 6–23)
CO2: 29 mEq/L (ref 19–32)
Calcium: 9.3 mg/dL (ref 8.4–10.5)
Chloride: 105 mEq/L (ref 96–112)
Creatinine, Ser: 0.92 mg/dL (ref 0.40–1.50)
GFR: 88.06 mL/min (ref >60.00)
Glucose, Bld: 99 mg/dL (ref 70–99)
Potassium: 4 mEq/L (ref 3.5–5.1)
Sodium: 140 mEq/L (ref 135–145)
Total Bilirubin: 0.4 mg/dL (ref 0.2–1.2)
Total Protein: 6.5 g/dL (ref 6.0–8.3)

## 2016-08-10 LAB — LIPID PANEL
Cholesterol: 171 mg/dL (ref 0–200)
HDL: 34 mg/dL — ABNORMAL LOW (ref >39.00)
NonHDL: 137.27
Total CHOL/HDL Ratio: 5
Triglycerides: 259 mg/dL — ABNORMAL HIGH (ref 0.0–149.0)
VLDL: 51.8 mg/dL — ABNORMAL HIGH (ref 0.0–40.0)

## 2016-08-10 LAB — HEMOGLOBIN A1C: Hgb A1c MFr Bld: 6.1 % (ref 4.6–6.5)

## 2016-08-10 LAB — TSH: TSH: 0.48 u[IU]/mL (ref 0.35–4.50)

## 2016-08-10 LAB — PSA: PSA: 0.36 ng/mL (ref 0.10–4.00)

## 2016-08-10 LAB — LDL CHOLESTEROL, DIRECT: Direct LDL: 105 mg/dL

## 2016-08-10 MED ORDER — ROSUVASTATIN CALCIUM 10 MG PO TABS: 10.0000 mg | ORAL_TABLET | Freq: Every day | ORAL | 3 refills | Status: DC

## 2016-08-10 MED ORDER — ASPIRIN EC 81 MG PO TBEC: 81.0000 mg | DELAYED_RELEASE_TABLET | Freq: Every day | ORAL | 3 refills | Status: DC

## 2016-08-10 NOTE — Progress Notes (Signed)
Pre visit review using our clinic review tool, if applicable. No additional management support is needed unless otherwise documented below in the visit note. 

## 2016-08-10 NOTE — Progress Notes (Signed)
Subjective:  Patient ID: Jaime Castillo, male    DOB: 06-13-1950  Age: 66 y.o. MRN: [id number]  CC: Annual Exam and Hypertension   HPI Jaime Castillo presents for a CPX.  He feels well and offers no complaints. He tells me his blood pressure has been well controlled. He has had no recent episodes of chest pain, shortness of breath, dyspnea on exertion, fatigue, palpitations, or edema.  Outpatient Medications Prior to Visit  Medication Sig Dispense Refill  . azelastine (ASTELIN) 0.1 % nasal spray Place 2 sprays into both nostrils 2 (two) times daily. Use in each nostril as directed 30 mL 12  . olmesartan (BENICAR) 20 MG tablet Take 1 tablet (20 mg total) by mouth every morning. Must keep April appt for future refills 30 tablet 1  . clindamycin (CLEOCIN) 300 MG capsule   0  . Multiple Vitamin (MULTIVITAMIN WITH MINERALS) TABS Take 1 tablet by mouth daily.    Marland Kitchen olmesartan (BENICAR) 20 MG tablet Take 1 tablet (20 mg total) by mouth every morning. 90 tablet 3   No facility-administered medications prior to visit.     ROS Review of Systems  Constitutional: Positive for unexpected weight change (some wt gain). Negative for activity change, appetite change, diaphoresis and fatigue.  HENT: Negative.   Eyes: Negative for visual disturbance.  Respiratory: Negative for cough, chest tightness, shortness of breath and wheezing.   Cardiovascular: Negative for chest pain, palpitations and leg swelling.  Gastrointestinal: Negative for abdominal pain, constipation, diarrhea, nausea and vomiting.  Endocrine: Negative.  Negative for cold intolerance and heat intolerance.  Genitourinary: Negative.  Negative for decreased urine volume, difficulty urinating, frequency, hematuria, penile swelling, scrotal swelling and testicular pain.  Musculoskeletal: Negative for arthralgias, back pain, myalgias and neck pain.  Skin: Negative.   Allergic/Immunologic: Negative.   Neurological: Negative.  Negative for  dizziness, weakness, numbness and headaches.  Hematological: Negative.  Negative for adenopathy. Does not bruise/bleed easily.  Psychiatric/Behavioral: Negative.     Objective:  BP 130/78 (BP Location: Left Arm, Patient Position: Sitting, Cuff Size: Normal)   Pulse 66   Temp 98.4 F (36.9 C) (Oral)   Resp 16   Ht 5\' 8"  (1.727 m)   Wt 191 lb 1.9 oz (86.7 kg)   SpO2 94%   BMI 29.06 kg/m   BP Readings from Last 3 Encounters:  08/10/16 130/78  08/04/15 124/82  07/31/14 128/76    Wt Readings from Last 3 Encounters:  08/10/16 191 lb 1.9 oz (86.7 kg)  08/04/15 188 lb (85.3 kg)  07/31/14 193 lb (87.5 kg)    Physical Exam  Constitutional: He is oriented to person, place, and time. No distress.  HENT:  Mouth/Throat: Oropharynx is clear and moist. No oropharyngeal exudate.  Eyes: Conjunctivae are normal. Right eye exhibits no discharge. Left eye exhibits no discharge. No scleral icterus.  Neck: Normal range of motion. Neck supple. No JVD present. No tracheal deviation present. No thyromegaly present.  Cardiovascular: Normal rate, regular rhythm, normal heart sounds and intact distal pulses.  Exam reveals no gallop and no friction rub.   No murmur heard. Pulmonary/Chest: Effort normal. No stridor. No respiratory distress. He has no wheezes. He has no rales. He exhibits no tenderness.  Abdominal: Soft. Bowel sounds are normal. He exhibits no distension and no mass. There is no tenderness. There is no rebound and no guarding. Hernia confirmed negative in the right inguinal area and confirmed negative in the left inguinal area.  Genitourinary: Prostate  normal, testes normal and penis normal. Rectal exam shows external hemorrhoid. Rectal exam shows no internal hemorrhoid, no fissure, no mass, no tenderness, anal tone normal and guaiac negative stool. Prostate is not enlarged and not tender. Right testis shows no mass, no swelling and no tenderness. Right testis is descended. Left testis shows  no mass, no swelling and no tenderness. Left testis is descended. Circumcised. No penile erythema or penile tenderness. No discharge found.  Musculoskeletal: Normal range of motion. He exhibits no edema or deformity.  Lymphadenopathy:    He has no cervical adenopathy.       Right: No inguinal adenopathy present.       Left: No inguinal adenopathy present.  Neurological: He is oriented to person, place, and time.  Skin: Skin is warm and dry. No rash noted. He is not diaphoretic. No erythema. No pallor.  Psychiatric: He has a normal mood and affect. His behavior is normal. Judgment and thought content normal.  Vitals reviewed.   Lab Results  Component Value Date   WBC 6.5 08/10/2016   HGB 15.0 08/10/2016   HCT 44.9 08/10/2016   PLT 141.0 (L) 08/10/2016   GLUCOSE 99 08/10/2016   CHOL 171 08/10/2016   TRIG 259.0 (H) 08/10/2016   HDL 34.00 (L) 08/10/2016   LDLDIRECT 105.0 08/10/2016   LDLCALC 87 08/04/2015   ALT 18 08/10/2016   AST 19 08/10/2016   NA 140 08/10/2016   K 4.0 08/10/2016   CL 105 08/10/2016   CREATININE 0.92 08/10/2016   BUN 9 08/10/2016   CO2 29 08/10/2016   TSH 0.48 08/10/2016   PSA 0.36 08/10/2016   HGBA1C 6.1 08/10/2016    No results found.  Assessment & Plan:   Jaime Castillo was seen today for annual exam and hypertension.  Diagnoses and all orders for this visit:  Routine general medical examination at a health care facility- exam completed, labs ordered and reviewed, vaccines reviewed and updated, he was referred for follow-up colonoscopy, patient education material was given. -     Lipid panel; Future -     Comprehensive metabolic panel; Future -     CBC with Differential/Platelet; Future -     TSH; Future -     PSA; Future -     Urinalysis, Routine w reflex microscopic; Future  Essential hypertension, benign- his blood pressure is adequately well controlled, electrolytes and renal function are normal, he was encouraged to work on his lifestyle  modifications.  Hyperglycemia- his A1c is up to 6.1%, he is prediabetic, no medications are needed at this time, he agrees to work on his lifestyle modifications. -     Hemoglobin A1c; Future  History of colonic polyps -     Ambulatory referral to Gastroenterology  Hyperlipidemia with target LDL less than 100- his Framingham risk score is elevated at 25% so Avastin to start taking a statin and an aspirin a day for cardiovascular risk reduction. -     rosuvastatin (CRESTOR) 10 MG tablet; Take 1 tablet (10 mg total) by mouth daily. -     aspirin EC 81 MG tablet; Take 1 tablet (81 mg total) by mouth daily.   I have discontinued Mr. Overfield multivitamin with minerals and clindamycin. I am also having him start on rosuvastatin and aspirin EC. Additionally, I am having him maintain his azelastine and olmesartan.  Meds ordered this encounter  Medications  . rosuvastatin (CRESTOR) 10 MG tablet    Sig: Take 1 tablet (10 mg total) by mouth  daily.    Dispense:  90 tablet    Refill:  3  . aspirin EC 81 MG tablet    Sig: Take 1 tablet (81 mg total) by mouth daily.    Dispense:  90 tablet    Refill:  3     Follow-up: Return in about 6 months (around 02/09/2017).  Scarlette Calico, MD

## 2016-08-10 NOTE — Patient Instructions (Signed)
 Health Maintenance, Male A healthy lifestyle and preventive care is important for your health and wellness. Ask your health care provider about what schedule of regular examinations is right for you. What should I know about weight and diet?  Eat a Healthy Diet  Eat plenty of vegetables, fruits, whole grains, low-fat dairy products, and lean protein.  Do not eat a lot of foods high in solid fats, added sugars, or salt. Maintain a Healthy Weight  Regular exercise can help you achieve or maintain a healthy weight. You should:  Do at least 150 minutes of exercise each week. The exercise should increase your heart rate and make you sweat (moderate-intensity exercise).  Do strength-training exercises at least twice a week. Watch Your Levels of Cholesterol and Blood Lipids  Have your blood tested for lipids and cholesterol every 5 years starting at 64 years of age. If you are at high risk for heart disease, you should start having your blood tested when you are 64 years old. You may need to have your cholesterol levels checked more often if:  Your lipid or cholesterol levels are high.  You are older than 64 years of age.  You are at high risk for heart disease. What should I know about cancer screening? Many types of cancers can be detected early and may often be prevented. Lung Cancer  You should be screened every year for lung cancer if:  You are a current smoker who has smoked for at least 30 years.  You are a former smoker who has quit within the past 15 years.  Talk to your health care provider about your screening options, when you should start screening, and how often you should be screened. Colorectal Cancer  Routine colorectal cancer screening usually begins at 64 years of age and should be repeated every 5-10 years until you are 64 years old. You may need to be screened more often if early forms of precancerous polyps or small growths are found. Your health care provider  may recommend screening at an earlier age if you have risk factors for colon cancer.  Your health care provider may recommend using home test kits to check for hidden blood in the stool.  A small camera at the end of a tube can be used to examine your colon (sigmoidoscopy or colonoscopy). This checks for the earliest forms of colorectal cancer. Prostate and Testicular Cancer  Depending on your age and overall health, your health care provider may do certain tests to screen for prostate and testicular cancer.  Talk to your health care provider about any symptoms or concerns you have about testicular or prostate cancer. Skin Cancer  Check your skin from head to toe regularly.  Tell your health care provider about any new moles or changes in moles, especially if:  There is a change in a mole's size, shape, or color.  You have a mole that is larger than a pencil eraser.  Always use sunscreen. Apply sunscreen liberally and repeat throughout the day.  Protect yourself by wearing long sleeves, pants, a wide-brimmed hat, and sunglasses when outside. What should I know about heart disease, diabetes, and high blood pressure?  If you are 18-39 years of age, have your blood pressure checked every 3-5 years. If you are 40 years of age or older, have your blood pressure checked every year. You should have your blood pressure measured twice-once when you are at a hospital or clinic, and once when you are not at   a hospital or clinic. Record the average of the two measurements. To check your blood pressure when you are not at a hospital or clinic, you can use:  An automated blood pressure machine at a pharmacy.  A home blood pressure monitor.  Talk to your health care provider about your target blood pressure.  If you are between 45-79 years old, ask your health care provider if you should take aspirin to prevent heart disease.  Have regular diabetes screenings by checking your fasting blood sugar  level.  If you are at a normal weight and have a low risk for diabetes, have this test once every three years after the age of 45.  If you are overweight and have a high risk for diabetes, consider being tested at a younger age or more often.  A one-time screening for abdominal aortic aneurysm (AAA) by ultrasound is recommended for men aged 65-75 years who are current or former smokers. What should I know about preventing infection? Hepatitis B  If you have a higher risk for hepatitis B, you should be screened for this virus. Talk with your health care provider to find out if you are at risk for hepatitis B infection. Hepatitis C  Blood testing is recommended for:  Everyone born from 1945 through 1965.  Anyone with known risk factors for hepatitis C. Sexually Transmitted Diseases (STDs)  You should be screened each year for STDs including gonorrhea and chlamydia if:  You are sexually active and are younger than 64 years of age.  You are older than 64 years of age and your health care provider tells you that you are at risk for this type of infection.  Your sexual activity has changed since you were last screened and you are at an increased risk for chlamydia or gonorrhea. Ask your health care provider if you are at risk.  Talk with your health care provider about whether you are at high risk of being infected with HIV. Your health care provider may recommend a prescription medicine to help prevent HIV infection. What else can I do?  Schedule regular health, dental, and eye exams.  Stay current with your vaccines (immunizations).  Do not use any tobacco products, such as cigarettes, chewing tobacco, and e-cigarettes. If you need help quitting, ask your health care provider.  Limit alcohol intake to no more than 2 drinks per day. One drink equals 12 ounces of beer, 5 ounces of wine, or 1 ounces of hard liquor.  Do not use street drugs.  Do not share needles.  Ask your health  care provider for help if you need support or information about quitting drugs.  Tell your health care provider if you often feel depressed.  Tell your health care provider if you have ever been abused or do not feel safe at home. This information is not intended to replace advice given to you by your health care provider. Make sure you discuss any questions you have with your health care provider. Document Released: 10/23/2007 Document Revised: 12/24/2015 Document Reviewed: 01/28/2015 Elsevier Interactive Patient Education  2017 [employer] Inc[employer]  

## 2016-09-06 ENCOUNTER — Telehealth: Payer: Self-pay

## 2016-09-06 MED ORDER — OLMESARTAN MEDOXOMIL 20 MG PO TABS: 20.0000 mg | ORAL_TABLET | Freq: Every morning | ORAL | 1 refills | Status: DC

## 2016-09-06 NOTE — Telephone Encounter (Signed)
[facility] sent er[facility] request for olmesartan 20 mg.

## 2016-10-08 ENCOUNTER — Encounter: Payer: Self-pay | Admitting: Internal Medicine

## 2017-03-17 ENCOUNTER — Other Ambulatory Visit: Payer: Self-pay | Admitting: Internal Medicine

## 2017-03-28 LAB — HM COLONOSCOPY

## 2017-09-05 ENCOUNTER — Other Ambulatory Visit: Payer: Self-pay | Admitting: Internal Medicine

## 2017-09-28 ENCOUNTER — Telehealth: Payer: Self-pay | Admitting: Internal Medicine

## 2017-09-28 DIAGNOSIS — E785 Hyperlipidemia, unspecified: Secondary | ICD-10-CM

## 2017-09-28 NOTE — Telephone Encounter (Signed)
Copied from [place] [id number]. Topic: Quick Communication - Rx Refill/Question >> Sep 28, 2017  1:59 PM Cleaster Corin, [place] wrote: Medication: rosuvastatin (CRESTOR) 10 MG tablet [[id number]]   Has the patient contacted their pharmacy? yes (Agent: If no, request that the patient contact the pharmacy for the refill.) (Agent: If yes, when and what did the pharmacy advise?)  Preferred Pharmacy (with phone number or street name): [facility] [place] - [address] [place] [address] Phone: [phone] Fax: [phone]    Agent: Please be advised that RX refills may take up to 3 business days. We ask that you follow-up with your pharmacy.

## 2017-09-28 NOTE — Telephone Encounter (Signed)
Request for refill of Crestor LOV with Dr. Ronnald Ramp 08/10/17 / Last refill: 08/10/17

## 2017-09-29 MED ORDER — ROSUVASTATIN CALCIUM 10 MG PO TABS
10.0000 mg | ORAL_TABLET | Freq: Every day | ORAL | 0 refills | Status: DC
Start: 2017-09-29 — End: 2017-10-19

## 2017-09-29 NOTE — Addendum Note (Signed)
Addended by: Earnstine Regal on: 09/29/2017 10:14 AM   Modules accepted: Orders

## 2017-09-29 NOTE — Telephone Encounter (Signed)
Pt has scheduled appt for 6/12. Per office policy sent 30 day to local pharmacy until appt...Jaime Castillo

## 2017-10-02 ENCOUNTER — Other Ambulatory Visit: Payer: Self-pay | Admitting: Internal Medicine

## 2017-10-04 ENCOUNTER — Telehealth: Payer: Self-pay | Admitting: Internal Medicine

## 2017-10-04 NOTE — Telephone Encounter (Signed)
Copied from [place] [id number]. Topic: Quick Communication - Rx Refill/Question >> Oct 04, 2017  4:47 PM Oliver Pila B wrote: Medication: olmesartan (BENICAR) 20 MG tablet [[id number]]   Has the patient contacted their pharmacy? Yes.   (Agent: If no, request that the patient contact the pharmacy for the refill.) (Agent: If yes, when and what did the pharmacy advise?)  [facility]  Agent: Please be advised that RX refills may take up to 3 business days. We ask that you follow-up with your pharmacy.

## 2017-10-05 MED ORDER — OLMESARTAN MEDOXOMIL 20 MG PO TABS: 20.0000 mg | ORAL_TABLET | Freq: Every morning | ORAL | 0 refills | Status: DC

## 2017-10-05 NOTE — Telephone Encounter (Signed)
Benicar refill. 30 day supply sent to pharmacy Last Refill:03/17/17 #90 Last OV: 08/10/16 PCP: Dr. Ronnald Ramp [facility]  Next OV: 10/19/17

## 2017-10-19 ENCOUNTER — Ambulatory Visit: Payer: BLUE CROSS/BLUE SHIELD | Admitting: Internal Medicine

## 2017-10-19 ENCOUNTER — Encounter: Payer: Self-pay | Admitting: Internal Medicine

## 2017-10-19 ENCOUNTER — Other Ambulatory Visit (INDEPENDENT_AMBULATORY_CARE_PROVIDER_SITE_OTHER): Payer: BLUE CROSS/BLUE SHIELD

## 2017-10-19 VITALS — BP 124/80 | HR 57 | Temp 98.2°F | Ht 68.0 in | Wt 181.8 lb

## 2017-10-19 DIAGNOSIS — R9431 Abnormal electrocardiogram [ECG] [EKG]: Secondary | ICD-10-CM

## 2017-10-19 DIAGNOSIS — I10 Essential (primary) hypertension: Secondary | ICD-10-CM | POA: Diagnosis not present

## 2017-10-19 DIAGNOSIS — R0600 Dyspnea, unspecified: Secondary | ICD-10-CM

## 2017-10-19 DIAGNOSIS — E785 Hyperlipidemia, unspecified: Secondary | ICD-10-CM

## 2017-10-19 DIAGNOSIS — Z23 Encounter for immunization: Secondary | ICD-10-CM | POA: Diagnosis not present

## 2017-10-19 DIAGNOSIS — R0609 Other forms of dyspnea: Secondary | ICD-10-CM

## 2017-10-19 DIAGNOSIS — R001 Bradycardia, unspecified: Secondary | ICD-10-CM | POA: Diagnosis not present

## 2017-10-19 DIAGNOSIS — R739 Hyperglycemia, unspecified: Secondary | ICD-10-CM

## 2017-10-19 DIAGNOSIS — Z0000 Encounter for general adult medical examination without abnormal findings: Secondary | ICD-10-CM

## 2017-10-19 DIAGNOSIS — N400 Enlarged prostate without lower urinary tract symptoms: Secondary | ICD-10-CM

## 2017-10-19 LAB — LIPID PANEL
Cholesterol: 167 mg/dL (ref 0–200)
HDL: 35.5 mg/dL — ABNORMAL LOW (ref >39.00)
LDL Cholesterol: 104 mg/dL — ABNORMAL HIGH (ref 0–99)
NonHDL: 131.58
Total CHOL/HDL Ratio: 5
Triglycerides: 138 mg/dL (ref 0.0–149.0)
VLDL: 27.6 mg/dL (ref 0.0–40.0)

## 2017-10-19 LAB — COMPREHENSIVE METABOLIC PANEL
ALT: 17 U/L (ref 0–53)
AST: 19 U/L (ref 0–37)
Albumin: 4.7 g/dL (ref 3.5–5.2)
Alkaline Phosphatase: 72 U/L (ref 39–117)
BUN: 11 mg/dL (ref 6–23)
CO2: 32 mEq/L (ref 19–32)
Calcium: 9.8 mg/dL (ref 8.4–10.5)
Chloride: 101 mEq/L (ref 96–112)
Creatinine, Ser: 0.98 mg/dL (ref 0.40–1.50)
GFR: 81.56 mL/min (ref >60.00)
Glucose, Bld: 104 mg/dL — ABNORMAL HIGH (ref 70–99)
Potassium: 4.8 mEq/L (ref 3.5–5.1)
Sodium: 139 mEq/L (ref 135–145)
Total Bilirubin: 0.6 mg/dL (ref 0.2–1.2)
Total Protein: 6.8 g/dL (ref 6.0–8.3)

## 2017-10-19 LAB — CBC WITH DIFFERENTIAL/PLATELET
Basophils Absolute: 0.1 10*3/uL (ref 0.0–0.1)
Basophils Relative: 1.1 % (ref 0.0–3.0)
Eosinophils Absolute: 0.1 10*3/uL (ref 0.0–0.7)
Eosinophils Relative: 1 % (ref 0.0–5.0)
HCT: 46.4 % (ref 39.0–52.0)
Hemoglobin: 15.8 g/dL (ref 13.0–17.0)
Lymphocytes Relative: 25.7 % (ref 12.0–46.0)
Lymphs Abs: 1.6 10*3/uL (ref 0.7–4.0)
MCHC: 34.1 g/dL (ref 30.0–36.0)
MCV: 94.9 fl (ref 78.0–100.0)
Monocytes Absolute: 0.3 10*3/uL (ref 0.1–1.0)
Monocytes Relative: 4.9 % (ref 3.0–12.0)
Neutro Abs: 4.1 10*3/uL (ref 1.4–7.7)
Neutrophils Relative %: 67.3 % (ref 43.0–77.0)
Platelets: 135 10*3/uL — ABNORMAL LOW (ref 150.0–400.0)
RBC: 4.89 Mil/uL (ref 4.22–5.81)
RDW: 14.6 % (ref 11.5–15.5)
WBC: 6.1 10*3/uL (ref 4.0–10.5)

## 2017-10-19 LAB — URINALYSIS, ROUTINE W REFLEX MICROSCOPIC
Bilirubin Urine: NEGATIVE
Hgb urine dipstick: NEGATIVE
Ketones, ur: NEGATIVE
Leukocytes, UA: NEGATIVE
Nitrite: NEGATIVE
RBC / HPF: NONE SEEN (ref 0–?)
Specific Gravity, Urine: 1.01 (ref 1.000–1.030)
Total Protein, Urine: NEGATIVE
Urine Glucose: NEGATIVE
Urobilinogen, UA: 0.2 (ref 0.0–1.0)
WBC, UA: NONE SEEN (ref 0–?)
pH: 7 (ref 5.0–8.0)

## 2017-10-19 LAB — PSA: PSA: 0.53 ng/mL (ref 0.10–4.00)

## 2017-10-19 LAB — HEMOGLOBIN A1C: Hgb A1c MFr Bld: 6.1 % (ref 4.6–6.5)

## 2017-10-19 LAB — TROPONIN I: TNIDX: 0 ug/l (ref 0.00–0.06)

## 2017-10-19 MED ORDER — ROSUVASTATIN CALCIUM 10 MG PO TABS: 10.0000 mg | ORAL_TABLET | Freq: Every day | ORAL | 1 refills | Status: DC

## 2017-10-19 MED ORDER — ASPIRIN EC 81 MG PO TBEC: 81.0000 mg | DELAYED_RELEASE_TABLET | Freq: Every day | ORAL | 1 refills | Status: DC

## 2017-10-19 MED ORDER — OLMESARTAN MEDOXOMIL 20 MG PO TABS: 20.0000 mg | ORAL_TABLET | Freq: Every morning | ORAL | 1 refills | Status: DC

## 2017-10-19 NOTE — Progress Notes (Signed)
Subjective:  Patient ID: Jaime Castillo, male    DOB: February 21, 1951  Age: 67 y.o. MRN: [id number]  CC: Hyperlipidemia and Annual Exam   HPI CONLIN BRAHM presents for a CPX.  He tells me his blood pressure has been controlled but over the last year he has developed dyspnea on exertion after doing yard work.  He denies any recent episodes of chest pain, diaphoresis, dizziness, lightheadedness, edema, fatigue, or near syncope.  He is not taking the statin.  He thought he did not need it because he was not fasting during his last lipids test.  Outpatient Medications Prior to Visit  Medication Sig Dispense Refill  . aspirin EC 81 MG tablet Take 1 tablet (81 mg total) by mouth daily. 90 tablet 3  . olmesartan (BENICAR) 20 MG tablet Take 1 tablet (20 mg total) by mouth every morning. 30 tablet 0  . azelastine (ASTELIN) 0.1 % nasal spray Place 2 sprays into both nostrils 2 (two) times daily. Use in each nostril as directed 30 mL 12  . rosuvastatin (CRESTOR) 10 MG tablet Take 1 tablet (10 mg total) by mouth daily. Must keep scheduled appt for future refills (Patient not taking: Reported on 10/19/2017) 30 tablet 0   No facility-administered medications prior to visit.     ROS Review of Systems  Constitutional: Negative.  Negative for appetite change, diaphoresis, fatigue and unexpected weight change.  HENT: Negative.   Respiratory: Positive for shortness of breath (DOE). Negative for apnea, cough, chest tightness, wheezing and stridor.   Cardiovascular: Negative.  Negative for chest pain, palpitations and leg swelling.  Gastrointestinal: Negative for abdominal pain, constipation, diarrhea and nausea.  Endocrine: Negative.   Genitourinary: Negative.  Negative for difficulty urinating, dysuria, hematuria, penile swelling, scrotal swelling, testicular pain and urgency.  Musculoskeletal: Negative.  Negative for arthralgias and myalgias.  Skin: Negative.  Negative for color change and rash.    Neurological: Negative.  Negative for weakness and light-headedness.  Hematological: Negative for adenopathy. Does not bruise/bleed easily.  Psychiatric/Behavioral: Negative.     Objective:  BP 124/80 (BP Location: Left Arm, Patient Position: Sitting, Cuff Size: Normal)   Pulse (!) 57   Temp 98.2 F (36.8 C) (Oral)   Ht 5\' 8"  (1.727 m)   Wt 181 lb 12 oz (82.4 kg)   SpO2 98%   BMI 27.63 kg/m   BP Readings from Last 3 Encounters:  10/19/17 124/80  08/10/16 130/78  08/04/15 124/82    Wt Readings from Last 3 Encounters:  10/19/17 181 lb 12 oz (82.4 kg)  08/10/16 191 lb 1.9 oz (86.7 kg)  08/04/15 188 lb (85.3 kg)    Physical Exam  Constitutional: He is oriented to person, place, and time. No distress.  HENT:  Mouth/Throat: Oropharynx is clear and moist. No oropharyngeal exudate.  Eyes: Conjunctivae are normal. No scleral icterus.  Neck: Normal range of motion. Neck supple. No JVD present. No thyromegaly present.  Cardiovascular: Regular rhythm, S1 normal, S2 normal and normal heart sounds. Bradycardia present. Exam reveals no gallop.  No murmur heard. 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   -Left atrial enlargement.   -  Nonspecific T-abnormality.   ABNORMAL - no old EKG for comparison   Pulmonary/Chest: Effort normal and breath sounds normal. No respiratory distress. He has no decreased breath sounds. He has no wheezes. He has no rhonchi. He has no rales.  Abdominal: Soft. Normal appearance  and bowel sounds are normal. He exhibits no mass. There is no hepatosplenomegaly. There is no tenderness. No hernia. Hernia confirmed negative in the right inguinal area and confirmed negative in the left inguinal area.  Genitourinary: Testes normal and penis normal. Rectal exam shows external hemorrhoid and internal hemorrhoid. Rectal exam shows no fissure, no mass, no tenderness, anal tone normal and guaiac  negative stool. Right testis shows no mass, no swelling and no tenderness. Left testis shows no mass, no swelling and no tenderness. Circumcised. No penile erythema or penile tenderness. No discharge found.  Genitourinary Comments: There is moderate anal stenosis that I could not pass to palpate the prostate gland  Musculoskeletal: Normal range of motion. He exhibits no edema, tenderness or deformity.  Lymphadenopathy:    He has no cervical adenopathy. No inguinal adenopathy noted on the right or left side.  Neurological: He is alert and oriented to person, place, and time.  Skin: Skin is warm and dry. No rash noted. He is not diaphoretic.    Lab Results  Component Value Date   WBC 6.1 10/19/2017   HGB 15.8 10/19/2017   HCT 46.4 10/19/2017   PLT 135.0 (L) 10/19/2017   GLUCOSE 104 (H) 10/19/2017   CHOL 167 10/19/2017   TRIG 138.0 10/19/2017   HDL 35.50 (L) 10/19/2017   LDLDIRECT 105.0 08/10/2016   LDLCALC 104 (H) 10/19/2017   ALT 17 10/19/2017   AST 19 10/19/2017   NA 139 10/19/2017   K 4.8 10/19/2017   CL 101 10/19/2017   CREATININE 0.98 10/19/2017   BUN 11 10/19/2017   CO2 32 10/19/2017   TSH 0.58 10/19/2017   PSA 0.53 10/19/2017   HGBA1C 6.1 10/19/2017    No results found.  Assessment & Plan:   Donovan was seen today for hyperlipidemia and annual exam.  Diagnoses and all orders for this visit:  Benign prostatic hyperplasia without lower urinary tract symptoms- His PSA is very low which is reassuring that he does not have prostate cancer.  He has no symptoms that need to be treated.  Routine general medical examination at a health care facility- Exam completed, labs reviewed, vaccines reviewed and updated, screening for colon cancer is up-to-date, he was asked to quit smoking, patient education material was given. -     EKG 12-Lead -     PSA; Future -     Lipid panel; Future -     HIV antibody; Future  Essential hypertension, benign- His blood pressure is adequately  well controlled.  Lab work is negative for secondary causes or endorgan damage. -     Comprehensive metabolic panel; Future -     CBC with Differential/Platelet; Future -     Urinalysis, Routine w reflex microscopic; Future -     Thyroid Panel With TSH; Future -     olmesartan (BENICAR) 20 MG tablet; Take 1 tablet (20 mg total) by mouth every morning.  Hyperglycemia- His A1c is at 6.1%.  Medical therapy is not indicated. -     Hemoglobin A1c; Future -     Comprehensive metabolic panel; Future  Hyperlipidemia with target LDL less than 100- He has an elevated ASCVD risk score.  I have asked him to start taking a statin and baby aspirin a day for CV risk reduction. -     Thyroid Panel With TSH; Future -     rosuvastatin (CRESTOR) 10 MG tablet; Take 1 tablet (10 mg total) by mouth daily. -  aspirin EC 81 MG tablet; Take 1 tablet (81 mg total) by mouth daily.  Abnormal electrocardiogram (ECG) (EKG)- I have asked him to undergo a myocardial perfusion imaging to screen for ischemia. -     Troponin I; Future  DOE (dyspnea on exertion)- His troponin is negative today.  See above. -     Troponin I; Future -     MYOCARDIAL PERFUSION IMAGING; Future  Bradycardia- His lab work is negative for secondary causes of this.  I do not think this is driving his symptoms.  Will pursue a work-up of ischemia. -     Thyroid Panel With TSH; Future  Abnormal electrocardiogram -     MYOCARDIAL PERFUSION IMAGING; Future  Need for pneumococcal vaccination -     Pneumococcal polysaccharide vaccine 23-valent greater than or equal to 2yo subcutaneous/IM   I have discontinued Camelia Eng. Palomarez's azelastine. I have also changed his rosuvastatin. Additionally, I am having him maintain his aspirin EC and olmesartan.  Meds ordered this encounter  Medications  . rosuvastatin (CRESTOR) 10 MG tablet    Sig: Take 1 tablet (10 mg total) by mouth daily.    Dispense:  90 tablet    Refill:  1  . aspirin EC 81 MG tablet     Sig: Take 1 tablet (81 mg total) by mouth daily.    Dispense:  90 tablet    Refill:  1  . olmesartan (BENICAR) 20 MG tablet    Sig: Take 1 tablet (20 mg total) by mouth every morning.    Dispense:  90 tablet    Refill:  1     Follow-up: Return in about 3 months (around 01/19/2018).  Scarlette Calico, MD

## 2017-10-19 NOTE — Patient Instructions (Signed)

## 2017-10-20 LAB — HIV ANTIBODY (ROUTINE TESTING W REFLEX): HIV 1&2 Ab, 4th Generation: NONREACTIVE

## 2017-10-20 LAB — THYROID PANEL WITH TSH
Free Thyroxine Index: 2.9 (ref 1.4–3.8)
T3 Uptake: 33 % (ref 22–35)
T4, Total: 8.8 ug/dL (ref 4.9–10.5)
TSH: 0.58 mIU/L (ref 0.40–4.50)

## 2017-10-21 NOTE — Addendum Note (Signed)
Addended by: Cresenciano Lick on: 10/21/2017 02:40 PM   Modules accepted: Orders

## 2017-11-15 ENCOUNTER — Encounter: Payer: Self-pay | Admitting: Internal Medicine

## 2017-12-05 ENCOUNTER — Telehealth (HOSPITAL_COMMUNITY): Payer: Self-pay | Admitting: Internal Medicine

## 2017-12-05 NOTE — Telephone Encounter (Signed)
[employer] Auth# [id number] exp 7/13/lb 10/24/17 Called pt and lmsg for him to CB to get sch for a myoview..RG 6/25 left msg for pt to call back/lb 11/08/17 Called pt and lmsg for him to Cb to sch myoview.Vassie Moment 7/9 mailed pt ltr to contact heart care to schedule/lb

## 2018-04-17 ENCOUNTER — Other Ambulatory Visit: Payer: Self-pay | Admitting: Internal Medicine

## 2018-04-17 DIAGNOSIS — I10 Essential (primary) hypertension: Secondary | ICD-10-CM

## 2018-10-18 ENCOUNTER — Other Ambulatory Visit: Payer: Self-pay | Admitting: Internal Medicine

## 2018-10-18 DIAGNOSIS — I10 Essential (primary) hypertension: Secondary | ICD-10-CM

## 2019-04-24 ENCOUNTER — Telehealth: Payer: Self-pay | Admitting: Internal Medicine

## 2019-04-24 DIAGNOSIS — I10 Essential (primary) hypertension: Secondary | ICD-10-CM

## 2019-04-24 NOTE — Telephone Encounter (Signed)
Patient requesting a 90 day supply of olmesartan (BENICAR) 20 MG tablet. Patient states he has not left his house since March and does not plan on leaving until COVID # decrease. Offered a virtual appointment patient states only if PCP recommends. Please advise the patient directly regarding the status   [facility] [address], [place], [place] [address]

## 2019-04-24 NOTE — Telephone Encounter (Addendum)
lvm for pt to call back.   You may inform patient of below.   RE: Patient was last seen in 2018. We will not be able to refill medication without a F2F with PCP.

## 2019-04-24 NOTE — Telephone Encounter (Signed)
Pt called back saying he does not wish to come in and it the medication cannot be refilled he needs to know how to go off of the medication.  He has a weeks with left.  CB#  [phone]

## 2019-04-25 NOTE — Telephone Encounter (Signed)
Pt stated he will not come into the office due to pandemic. Is there a certain way to come off of his BP medication?

## 2019-04-26 NOTE — Telephone Encounter (Addendum)
Pt contacted and informed of same.   Pt stated that he has done research and he can come off gradually. Pt stated that if he had to choose, he will come off of medication instead of coming in for an appointment.   Pt has been informed of below. Pt continued stating that he is not coming in for a visit. Pt stated that did not understand why enough could not be sent in so he can ween himself off as he stated his research said he could.   Informed pt that medication was not going to be sent in per PCP. Pt stated that he was sad to hear that and will establish with someone else.

## 2019-04-26 NOTE — Telephone Encounter (Addendum)
It is not safe for me to prescribe an antihypertensive in someone I have not seen in over a year. He needs to be seen, examined, have a blood pressure checked, and to do lab work to monitor his kidney function and electrolytes.   Routing comment    Above comment from Dr. Ronnald Ramp:

## 2022-10-26 ENCOUNTER — Emergency Department (HOSPITAL_BASED_OUTPATIENT_CLINIC_OR_DEPARTMENT_OTHER): Payer: No Typology Code available for payment source

## 2022-10-26 ENCOUNTER — Inpatient Hospital Stay (HOSPITAL_BASED_OUTPATIENT_CLINIC_OR_DEPARTMENT_OTHER)
Admission: EM | Admit: 2022-10-26 | Discharge: 2022-10-30 | DRG: 304 | Disposition: A | Discharge status: Home / Self Care | Payer: No Typology Code available for payment source | Attending: Internal Medicine | Admitting: Internal Medicine

## 2022-10-26 ENCOUNTER — Emergency Department (HOSPITAL_BASED_OUTPATIENT_CLINIC_OR_DEPARTMENT_OTHER): Payer: No Typology Code available for payment source | Admitting: Radiology

## 2022-10-26 ENCOUNTER — Other Ambulatory Visit: Payer: Self-pay

## 2022-10-26 ENCOUNTER — Encounter (HOSPITAL_BASED_OUTPATIENT_CLINIC_OR_DEPARTMENT_OTHER): Payer: Self-pay

## 2022-10-26 DIAGNOSIS — F17210 Nicotine dependence, cigarettes, uncomplicated: Secondary | ICD-10-CM | POA: Diagnosis present

## 2022-10-26 DIAGNOSIS — E871 Hypo-osmolality and hyponatremia: Secondary | ICD-10-CM | POA: Diagnosis not present

## 2022-10-26 DIAGNOSIS — Z7982 Long term (current) use of aspirin: Secondary | ICD-10-CM

## 2022-10-26 DIAGNOSIS — Z79899 Other long term (current) drug therapy: Secondary | ICD-10-CM | POA: Diagnosis not present

## 2022-10-26 DIAGNOSIS — I71012 Dissection of descending thoracic aorta: Secondary | ICD-10-CM | POA: Diagnosis present

## 2022-10-26 DIAGNOSIS — I161 Hypertensive emergency: Secondary | ICD-10-CM | POA: Diagnosis present

## 2022-10-26 DIAGNOSIS — Z880 Allergy status to penicillin: Secondary | ICD-10-CM | POA: Diagnosis not present

## 2022-10-26 DIAGNOSIS — N400 Enlarged prostate without lower urinary tract symptoms: Secondary | ICD-10-CM | POA: Diagnosis present

## 2022-10-26 DIAGNOSIS — Z91138 Patient's unintentional underdosing of medication regimen for other reason: Secondary | ICD-10-CM

## 2022-10-26 DIAGNOSIS — I771 Stricture of artery: Secondary | ICD-10-CM | POA: Diagnosis present

## 2022-10-26 DIAGNOSIS — N179 Acute kidney failure, unspecified: Secondary | ICD-10-CM | POA: Diagnosis present

## 2022-10-26 DIAGNOSIS — R9431 Abnormal electrocardiogram [ECG] [EKG]: Secondary | ICD-10-CM | POA: Diagnosis present

## 2022-10-26 DIAGNOSIS — T465X6A Underdosing of other antihypertensive drugs, initial encounter: Secondary | ICD-10-CM | POA: Diagnosis present

## 2022-10-26 DIAGNOSIS — I1A0 Resistant hypertension: Secondary | ICD-10-CM | POA: Diagnosis not present

## 2022-10-26 DIAGNOSIS — N280 Ischemia and infarction of kidney: Secondary | ICD-10-CM | POA: Diagnosis present

## 2022-10-26 DIAGNOSIS — E785 Hyperlipidemia, unspecified: Secondary | ICD-10-CM | POA: Diagnosis present

## 2022-10-26 DIAGNOSIS — I71019 Dissection of thoracic aorta, unspecified: Principal | ICD-10-CM | POA: Diagnosis present

## 2022-10-26 DIAGNOSIS — I700 Atherosclerosis of aorta: Secondary | ICD-10-CM | POA: Diagnosis present

## 2022-10-26 DIAGNOSIS — I10 Essential (primary) hypertension: Secondary | ICD-10-CM | POA: Diagnosis present

## 2022-10-26 DIAGNOSIS — I7143 Infrarenal abdominal aortic aneurysm, without rupture: Secondary | ICD-10-CM | POA: Diagnosis present

## 2022-10-26 DIAGNOSIS — I7100 Dissection of unspecified site of aorta: Secondary | ICD-10-CM

## 2022-10-26 DIAGNOSIS — R079 Chest pain, unspecified: Secondary | ICD-10-CM | POA: Diagnosis not present

## 2022-10-26 DIAGNOSIS — R739 Hyperglycemia, unspecified: Secondary | ICD-10-CM | POA: Diagnosis present

## 2022-10-26 DIAGNOSIS — Z8719 Personal history of other diseases of the digestive system: Secondary | ICD-10-CM

## 2022-10-26 DIAGNOSIS — D696 Thrombocytopenia, unspecified: Secondary | ICD-10-CM | POA: Diagnosis present

## 2022-10-26 DIAGNOSIS — F17200 Nicotine dependence, unspecified, uncomplicated: Secondary | ICD-10-CM | POA: Diagnosis not present

## 2022-10-26 LAB — TROPONIN I (HIGH SENSITIVITY)
Troponin I (High Sensitivity): 14 ng/L (ref <18)
Troponin I (High Sensitivity): 14 ng/L (ref <18)
Troponin I (High Sensitivity): 14 ng/L (ref <18)

## 2022-10-26 LAB — CBC
HCT: 44.6 % (ref 39.0–52.0)
Hemoglobin: 15 g/dL (ref 13.0–17.0)
MCH: 30.9 pg (ref 26.0–34.0)
MCHC: 33.6 g/dL (ref 30.0–36.0)
MCV: 92 fL (ref 80.0–100.0)
Platelets: 102 10*3/uL — ABNORMAL LOW (ref 150–400)
RBC: 4.85 MIL/uL (ref 4.22–5.81)
RDW: 13.9 % (ref 11.5–15.5)
WBC: 9.9 10*3/uL (ref 4.0–10.5)
nRBC: 0 % (ref 0.0–0.2)

## 2022-10-26 LAB — BASIC METABOLIC PANEL
Anion gap: 11 (ref 5–15)
BUN: 16 mg/dL (ref 8–23)
CO2: 22 mmol/L (ref 22–32)
Calcium: 9.5 mg/dL (ref 8.9–10.3)
Chloride: 102 mmol/L (ref 98–111)
Creatinine, Ser: 0.97 mg/dL (ref 0.61–1.24)
GFR, Estimated: >60 mL/min (ref >60)
Glucose, Bld: 128 mg/dL — ABNORMAL HIGH (ref 70–99)
Potassium: 3.9 mmol/L (ref 3.5–5.1)
Sodium: 135 mmol/L (ref 135–145)

## 2022-10-26 LAB — HIV ANTIBODY (ROUTINE TESTING W REFLEX): HIV Screen 4th Generation wRfx: NONREACTIVE

## 2022-10-26 LAB — HEPATIC FUNCTION PANEL
ALT: 11 U/L (ref 0–44)
AST: 20 U/L (ref 15–41)
Albumin: 4.7 g/dL (ref 3.5–5.0)
Alkaline Phosphatase: 61 U/L (ref 38–126)
Bilirubin, Direct: 0.3 mg/dL — ABNORMAL HIGH (ref 0.0–0.2)
Indirect Bilirubin: 1.1 mg/dL — ABNORMAL HIGH (ref 0.3–0.9)
Total Bilirubin: 1.4 mg/dL — ABNORMAL HIGH (ref 0.3–1.2)
Total Protein: 7.5 g/dL (ref 6.5–8.1)

## 2022-10-26 LAB — LIPASE, BLOOD: Lipase: 14 U/L (ref 11–51)

## 2022-10-26 LAB — MRSA NEXT GEN BY PCR, NASAL: MRSA by PCR Next Gen: NOT DETECTED

## 2022-10-26 MED ORDER — NICOTINE 14 MG/24HR TD PT24
14.0000 mg | MEDICATED_PATCH | Freq: Every day | TRANSDERMAL | Status: DC
  Administered 2022-10-27 – 2022-10-30 (×4): 14 mg via TRANSDERMAL
  Filled 2022-10-26 (×4): qty 1

## 2022-10-26 MED ORDER — MORPHINE SULFATE (PF) 4 MG/ML IV SOLN
4.0000 mg | Freq: Once | INTRAVENOUS | Status: AC
  Administered 2022-10-26: 4 mg via INTRAVENOUS
  Filled 2022-10-26: qty 1

## 2022-10-26 MED ORDER — IOHEXOL 350 MG/ML SOLN
100.0000 mL | Freq: Once | INTRAVENOUS | Status: AC | PRN
  Administered 2022-10-26: 100 mL via INTRAVENOUS

## 2022-10-26 MED ORDER — AMLODIPINE BESYLATE 10 MG PO TABS
10.0000 mg | ORAL_TABLET | Freq: Every day | ORAL | Status: DC
  Administered 2022-10-26 – 2022-10-30 (×6): 10 mg via ORAL
  Filled 2022-10-26 (×6): qty 1

## 2022-10-26 MED ORDER — ESMOLOL HCL-SODIUM CHLORIDE 2000 MG/100ML IV SOLN
25.0000 ug/kg/min | INTRAVENOUS | Status: DC
  Administered 2022-10-26: 25 ug/kg/min via INTRAVENOUS
  Administered 2022-10-26: 125 ug/kg/min via INTRAVENOUS
  Administered 2022-10-26: 250 ug/kg/min via INTRAVENOUS
  Administered 2022-10-26: 50 ug/kg/min via INTRAVENOUS
  Administered 2022-10-26: 75 ug/kg/min via INTRAVENOUS
  Administered 2022-10-26: 100 ug/kg/min via INTRAVENOUS
  Administered 2022-10-26: 225 ug/kg/min via INTRAVENOUS
  Administered 2022-10-27 (×2): 300 ug/kg/min via INTRAVENOUS
  Administered 2022-10-27: 275 ug/kg/min via INTRAVENOUS
  Administered 2022-10-27 (×3): 300 ug/kg/min via INTRAVENOUS
  Administered 2022-10-27: 25 ug/kg/min via INTRAVENOUS
  Administered 2022-10-27: 300 ug/kg/min via INTRAVENOUS
  Administered 2022-10-27: 225 ug/kg/min via INTRAVENOUS
  Administered 2022-10-27: 300 ug/kg/min via INTRAVENOUS
  Administered 2022-10-28: 75 ug/kg/min via INTRAVENOUS
  Administered 2022-10-28: 125 ug/kg/min via INTRAVENOUS
  Filled 2022-10-26 (×15): qty 100

## 2022-10-26 MED ORDER — MORPHINE SULFATE (PF) 2 MG/ML IV SOLN: 1.0000 mg | Freq: Four times a day (QID) | INTRAVENOUS | Status: DC | PRN

## 2022-10-26 MED ORDER — CHLORHEXIDINE GLUCONATE CLOTH 2 % EX PADS
6.0000 | MEDICATED_PAD | Freq: Every day | CUTANEOUS | Status: DC
  Administered 2022-10-26 – 2022-10-29 (×4): 6 via TOPICAL

## 2022-10-26 MED ORDER — ORAL CARE MOUTH RINSE: 15.0000 mL | OROMUCOSAL | Status: DC | PRN

## 2022-10-26 MED ORDER — HEPARIN SODIUM (PORCINE) 5000 UNIT/ML IJ SOLN
5000.0000 [IU] | Freq: Three times a day (TID) | INTRAMUSCULAR | Status: DC
  Administered 2022-10-26 – 2022-10-30 (×11): 5000 [IU] via SUBCUTANEOUS
  Filled 2022-10-26 (×11): qty 1

## 2022-10-26 MED ORDER — DOCUSATE SODIUM 100 MG PO CAPS: 100.0000 mg | ORAL_CAPSULE | Freq: Two times a day (BID) | ORAL | Status: DC | PRN

## 2022-10-26 MED ORDER — POLYETHYLENE GLYCOL 3350 17 G PO PACK: 17.0000 g | PACK | Freq: Every day | ORAL | Status: DC | PRN

## 2022-10-26 NOTE — Progress Notes (Signed)
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  eICU Interventions  New Patient Evaluation.        [name] 10/26/2022, 10:09 PM

## 2022-10-26 NOTE — Consult Note (Signed)
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  He has been having pain for 3 days his pain has improved significantly.  He does have significant hypertension.  Agree with plans for blood pressure control to maintain systolic blood pressure less than 120 and heart rate less than 80.  He is currently on esmolol.  I will likely order a follow-up CT angio in 48 hours.  We have discussed the importance of tobacco cessation.  Vascular surgery will follow closely.  REASON FOR CONSULT:    Intramural hematoma of thoracic aorta seen on CT angio  HPI:   Jaime Castillo is a 72 y.o. male who developed a fairly sudden onset of chest pain on Sunday afternoon (2 days ago).  He describes this as a chest pressure that was fairly persistent.  Initially had some associated shortness of breath but this has resolved.  His chest pain has also improved significantly.  He has no family history of aortic aneurysms or aortic disease that he is aware of.  He denies any sudden onset abdominal pain or back pain.  He denies any postprandial abdominal pain, nausea or vomiting.  He has a 1 pack/day smoking history for 50 years.  He does have a history of hypertension.  Past Medical History:  Diagnosis Date   Colonic polyp    Hypertension     Family History  Problem Relation Age of Onset   Cancer Other        colon ca 1st degree relative <60    SOCIAL HISTORY: Social History   Tobacco Use   Smoking status: Every Day    Packs/day: 1.00    Years: 25.00    Additional pack years: 0.00    Total pack years: 25.00    Types: Cigarettes   Smokeless tobacco: Never  Substance Use Topics   Alcohol use: No    Comment: ber q week    Allergies  Allergen Reactions   Amoxil [Amoxicillin] Hives    itching    Current Facility-Administered Medications  Medication Dose Route Frequency  Provider Last Rate Last Admin   Chlorhexidine Gluconate Cloth 2 % PADS 6 each  6 each Topical Daily Charlott Holler, MD       esmolol (BREVIBLOC) 2000 mg / 100 mL (20 mg/mL) infusion  25-300 mcg/kg/min Intravenous Continuous Terrilee Files, MD 42.8 mL/hr at 10/26/22 2103 175 mcg/kg/min at 10/26/22 2103   Oral care mouth rinse  15 mL Mouth Rinse PRN Charlott Holler, MD        REVIEW OF SYSTEMS:  [X]  denotes positive finding, [ ]  denotes negative finding Cardiac  Comments:  Chest pain or chest pressure: x   Shortness of breath upon exertion:    Short of breath when lying flat:    Irregular heart rhythm:        Vascular    Pain in calf, thigh, or hip brought on by ambulation:    Pain in feet at night that wakes you up from your sleep:     Blood clot in your veins:    Leg swelling:         Pulmonary    Oxygen at home:    Productive cough:     Wheezing:         Neurologic    Sudden weakness in arms or legs:     Sudden  numbness in arms or legs:     Sudden onset of difficulty speaking or slurred speech:    Temporary loss of vision in one eye:     Problems with dizziness:         Gastrointestinal    Blood in stool:     Vomited blood:         Genitourinary    Burning when urinating:     Blood in urine:        Psychiatric    Major depression:         Hematologic    Bleeding problems:    Problems with blood clotting too easily:        Skin    Rashes or ulcers:        Constitutional    Fever or chills:    -  PHYSICAL EXAM:   Vitals:   10/26/22 1945 10/26/22 1950 10/26/22 2000 10/26/22 2100  BP: (!) 197/120 (!) 200/104 (!) 194/96 (!) 192/100  Pulse: 79 72 82 76  Resp: 18 18 16 14   Temp:      TempSrc:      SpO2: 96% 96% 96% 96%  Weight:      Height:       Body mass index is 26.58 kg/m. GENERAL: The patient is a well-nourished male, in no acute distress. The vital signs are documented above. CARDIAC: There is a regular rate and rhythm.  VASCULAR: I do not  detect carotid bruits. He has palpable femoral, popliteal, dorsalis pedis, and posterior tibial pulses bilaterally. PULMONARY: There is good air exchange bilaterally without wheezing or rales. ABDOMEN: Soft and non-tender with normal pitched bowel sounds.  I do not palpate an abdominal aortic aneurysm. MUSCULOSKELETAL: There are no major deformities. NEUROLOGIC: No focal weakness or paresthesias are detected. SKIN: There are no ulcers or rashes noted. PSYCHIATRIC: The patient has a normal affect.  DATA:    CT ANGIO CHEST ABDOMEN PELVIS: I reviewed the images of his CT angio of the chest abdomen and pelvis.  He has intramural hematoma noted in his thoracic aorta that extends up near the takeoff of the left subclavian artery.  I do not see significant penetrating aortic ulcers.  He has a filling defect just below the IMA and there appears to be a focal dissection at the level of the IMA and the infrarenal aorta.  This is fairly subtle.  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  There is some narrowing of the inferior right renal artery and superior left renal artery.  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  Labs are pending.  Waverly Ferrari [facility]

## 2022-10-26 NOTE — ED Provider Notes (Signed)
[place] EMERGENCY DEPARTMENT AT [facility] Provider Note   CSN: [id number] Arrival date & time: 10/26/22  1401     History  Chief Complaint  Patient presents with   Chest Pain    Jaime Castillo is a 72 y.o. male.here with chest pain to back for 3 days. Comes and goes. Thinks is trapped gas. No fever, sob, n/v, dizziness, diaphoresis. Some improvement with tylenol. No history of cardiac problems. Hx of htn, goes up with doctors visits, but this is higher than normal. Has been taking his meds.   The history is provided by the patient.  Chest Pain Pain location:  Substernal area Pain quality: aching   Pain radiates to:  Mid back Pain severity:  Moderate Onset quality:  Gradual Duration:  3 days Timing:  Intermittent Progression:  Unchanged Chronicity:  New Relieved by:  Nothing Worsened by:  Nothing Ineffective treatments: tylenol. Associated symptoms: no cough, no diaphoresis, no dizziness, no dysphagia, no fever, no nausea, no shortness of breath and no vomiting   Risk factors: high cholesterol, hypertension and smoking        Home Medications Prior to Admission medications   Medication Sig Start Date End Date Taking? Authorizing Provider  aspirin EC 81 MG tablet Take 1 tablet (81 mg total) by mouth daily. 10/19/17   Etta Grandchild, MD  olmesartan (BENICAR) 20 MG tablet TAKE 1 TABLET BY MOUTH EVERY DAY IN THE MORNING 10/18/18   Etta Grandchild, MD  rosuvastatin (CRESTOR) 10 MG tablet Take 1 tablet (10 mg total) by mouth daily. 10/19/17   Etta Grandchild, MD      Allergies    Amoxil [amoxicillin]    Review of Systems   Review of Systems  Constitutional:  Negative for diaphoresis and fever.  HENT:  Negative for trouble swallowing.   Respiratory:  Negative for cough and shortness of breath.   Cardiovascular:  Positive for chest pain.  Gastrointestinal:  Negative for nausea and vomiting.  Neurological:  Negative for dizziness.    Physical Exam Updated  Vital Signs BP (!) 210/101   Pulse 68   Temp 98.6 F (37 C) (Oral)   Resp 18   Ht 5\' 9"  (1.753 m)   Wt 81.6 kg   SpO2 100%   BMI 26.58 kg/m  Physical Exam Vitals and nursing note reviewed.  Constitutional:      General: He is not in acute distress.    Appearance: Normal appearance. He is well-developed.  HENT:     Head: Normocephalic and atraumatic.  Eyes:     Conjunctiva/sclera: Conjunctivae normal.  Cardiovascular:     Rate and Rhythm: Normal rate and regular rhythm.     Heart sounds: Normal heart sounds. No murmur heard. Pulmonary:     Effort: Pulmonary effort is normal. No respiratory distress.     Breath sounds: Normal breath sounds.  Abdominal:     Palpations: Abdomen is soft.     Tenderness: There is no abdominal tenderness. There is no guarding or rebound.  Musculoskeletal:        General: No swelling. Normal range of motion.     Cervical back: Neck supple.     Right lower leg: No tenderness. No edema.     Left lower leg: No tenderness. No edema.  Skin:    General: Skin is warm and dry.     Capillary Refill: Capillary refill takes less than 2 seconds.  Neurological:     General: No focal deficit  present.     Mental Status: He is alert.     Sensory: No sensory deficit.     Motor: No weakness.     ED Results / Procedures / Treatments   Labs (all labs ordered are listed, but only abnormal results are displayed) Labs Reviewed  BASIC METABOLIC PANEL - Abnormal; Notable for the following components:      Result Value   Glucose, Bld 128 (*)    All other components within normal limits  CBC - Abnormal; Notable for the following components:   Platelets 102 (*)    All other components within normal limits  HEPATIC FUNCTION PANEL - Abnormal; Notable for the following components:   Total Bilirubin 1.4 (*)    Bilirubin, Direct 0.3 (*)    Indirect Bilirubin 1.1 (*)    All other components within normal limits  MRSA NEXT GEN BY PCR, NASAL  LIPASE, BLOOD  HIV  ANTIBODY (ROUTINE TESTING W REFLEX)  CBC  BASIC METABOLIC PANEL  MAGNESIUM  PHOSPHORUS  TROPONIN I (HIGH SENSITIVITY)  TROPONIN I (HIGH SENSITIVITY)  TROPONIN I (HIGH SENSITIVITY)    EKG EKG Interpretation  Date/Time:  Tuesday October 26 2022 14:20:49 EDT Ventricular Rate:  86 PR Interval:  156 QRS Duration: 94 QT Interval:  368 QTC Calculation: 440 R Axis:   251 Text Interpretation: Sinus rhythm with marked sinus arrhythmia Biatrial enlargement Right superior axis deviation Incomplete right bundle branch block Anterior infarct , age undetermined Abnormal ECG No previous ECGs available Confirmed by Meridee Score [phone]) on 10/26/2022 2:56:47 PM  Radiology CT Angio Chest/Abd/Pel for Dissection W and/or W/WO  Result Date: 10/26/2022 CLINICAL DATA:  Acute aortic syndrome suspected EXAM: CT ANGIOGRAPHY CHEST, ABDOMEN AND PELVIS TECHNIQUE: Non-contrast CT of the chest was initially obtained. Multidetector CT imaging through the chest, abdomen and pelvis was performed using the standard protocol during bolus administration of intravenous contrast. Multiplanar reconstructed images and MIPs were obtained and reviewed to evaluate the vascular anatomy. RADIATION DOSE REDUCTION: This exam was performed according to the departmental dose-optimization program which includes automated exposure control, adjustment of the mA and/or kV according to patient size and/or use of iterative reconstruction technique. CONTRAST:  OMNIPAQUE IOHEXOL 350 MG/ML SOLN COMPARISON:  None Available. FINDINGS: CTA CHEST FINDINGS Cardiovascular: Normal heart size. No pericardial effusion. No evidence of pulmonary embolus. Mediastinum/Nodes: Esophagus and thyroid are unremarkable. No enlarged lymph nodes seen in the chest. Lungs/Pleura: Central airways are patent. Mild centrilobular emphysema. No consolidation or pneumothorax. Trace left pleural effusion. Musculoskeletal: No chest wall abnormality. No acute or significant  osseous findings. Review of the MIP images confirms the above findings. CTA ABDOMEN AND PELVIS FINDINGS VASCULAR Aorta: Hyperdense eccentric wall thickening of the descending thoracic aorta which extends just distal to the takeoff of the left subclavian artery inferiorly to the level of the IMA terminating with small short segment dissection (series 5, image 92). Intramural blood pool noted on series 5, image 63. Mild aortic valve calcifications and severe aortic atherosclerotic disease. Ascending thoracic aorta is upper limits of normal in size, measuring up to 3.9 cm. Dilated descending thoracic aorta measures up to 4.0 cm. Abdominal aorta with areas of focal aneurysmal dilation involving the infrarenal abdominal aorta measuring up to 3.4 cm and 3.2 cm. Focal area severe soft plaque at the takeoff of the renal arteries causing moderate luminal narrowing seen on series 100, image 165. No evidence of branch vessel involvement. Celiac: Patent. Mild narrowing at the origin due to IMH. SMA: Patent  without evidence of aneurysm, dissection, vasculitis or significant stenosis. 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 Severe narrowing at the origin of the superior left renal artery due which is likely due to a combination of atherosclerotic disease and IMH. More inferior left renal arteries are occluded at their origins with distal reconstitution of flow. Superior right renal artery with moderate narrowing at the origin due to atherosclerotic disease. Severe narrowing of the inferior right renal artery due to IMH. IMA: Patent. Arises from the false lumen. Inflow: Patent without evidence of aneurysm, dissection, vasculitis or significant stenosis. Veins: No obvious venous abnormality within the limitations of this arterial phase study. Review of the MIP images confirms the above findings. NON-VASCULAR Hepatobiliary: No focal liver abnormality is seen. No gallstones, gallbladder wall thickening, or biliary  dilatation. Pancreas: Unremarkable. No pancreatic ductal dilatation or surrounding inflammatory changes. Spleen: Normal in size without focal abnormality. Adrenals/Urinary Tract: Bilateral adrenal glands are unremarkable. No hydronephrosis or nephrolithiasis. Bladder is unremarkable. Stomach/Bowel: Stomach is within normal limits. No evidence of bowel wall thickening, distention, or inflammatory changes. Lymphatic: No enlarged lymph nodes seen in the abdomen or pelvis. Reproductive: Prostate is unremarkable. Other: No abdominal wall hernia or abnormality. No abdominopelvic ascites. Musculoskeletal: No acute or significant osseous findings. Radiologic leads Review of the MIP images confirms the above findings. IMPRESSION: 1. Type B intramural hematoma which extends just distal to the takeoff of the left subclavian artery to the to the origin of the IMA where there is a short-segment dissection flap. 2. 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 Intramural hematoma causes severe narrowing at the origin of the superior left renal artery, occlusion of the inferior left renal arteries, and severe narrowing of the inferior right renal artery. 3. Severe aortic Atherosclerosis (ICD10-I70.0) including a focal area of severe soft plaque at the level of the renal arteries causing moderate luminal narrowing. 4. 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 Recommend follow-up ultrasound every 3 years. This recommendation follows ACR consensus guidelines: White Paper of the ACR Incidental Findings Committee II on Vascular Findings. J Am Coll Radiol 2013; 10:789-794. Electronically Signed   By: Allegra Lai M.D.   On: 10/26/2022 19:12   US Abdomen Limited RUQ (LIVER/GB)  Result Date: 10/26/2022 CLINICAL DATA:  Epigastric pain. EXAM: ULTRASOUND ABDOMEN LIMITED RIGHT UPPER QUADRANT COMPARISON:  None Available. FINDINGS: Gallbladder: No gallstones or wall thickening visualized. No sonographic  Murphy sign noted by sonographer. Common bile duct: Diameter: 7 mm Liver: No focal lesion identified. Within normal limits in parenchymal echogenicity. Portal vein is patent on color Doppler imaging with normal direction of blood flow towards the liver. Other: None. IMPRESSION: Unremarkable right upper quadrant ultrasound. Electronically Signed   By: Elgie Collard M.D.   On: 10/26/2022 17:48   DG Chest 2 View  Result Date: 10/26/2022 CLINICAL DATA:  Chest pain EXAM: CHEST - 2 VIEW COMPARISON:  None Available. FINDINGS: No consolidation, pneumothorax or effusion. No edema. Normal cardiopericardial silhouette. IMPRESSION: No acute cardiopulmonary disease. Electronically Signed   By: Karen Kays M.D.   On: 10/26/2022 15:51    Procedures .Critical Care  Performed by: Terrilee Files, MD Authorized by: Terrilee Files, MD   Critical care provider statement:    Critical care time (minutes):  45   Critical care time was exclusive of:  Separately billable procedures and treating other patients   Critical care was necessary to treat or prevent imminent or life-threatening deterioration of the following  conditions:  Circulatory failure   Critical care was time spent personally by me on the following activities:  Development of treatment plan with patient or surrogate, discussions with consultants, evaluation of patient's response to treatment, examination of patient, obtaining history from patient or surrogate, ordering and performing treatments and interventions, ordering and review of laboratory studies, ordering and review of radiographic studies, pulse oximetry, re-evaluation of patient's condition and review of old charts   I assumed direction of critical care for this patient from another provider in my specialty: no       Medications Ordered in ED Medications  esmolol (BREVIBLOC) 2000 mg / 100 mL (20 mg/mL) infusion (175 mcg/kg/min  81.6 kg Intravenous IV [employer] 10/26/22 2103)   Chlorhexidine Gluconate Cloth 2 % PADS 6 each (has no administration in time range)  Oral care mouth rinse (has no administration in time range)  heparin injection 5,000 Units (has no administration in time range)  docusate sodium (COLACE) capsule 100 mg (has no administration in time range)  polyethylene glycol (MIRALAX / GLYCOLAX) packet 17 g (has no administration in time range)  morphine (PF) 2 MG/ML injection 1-2 mg (has no administration in time range)  morphine (PF) 4 MG/ML injection 4 mg (4 mg Intravenous Given 10/26/22 1818)  iohexol (OMNIPAQUE) 350 MG/ML injection 100 mL (100 mLs Intravenous Contrast Given 10/26/22 1811)    ED Course/ Medical Decision Making/ A&P Clinical Course as of 10/26/22 2129  Tue Oct 26, 2022  1556 Chest xray interpreted by me as no acute pumonary findings. Awaiting radiology reading.  [MB]  1911 Received a call from radiology that the patient has a type B dissection.  I updated the patient and placed a call into vascular surgery.  Dr. Edilia Bo asked me to get the patient admitted to critical care for blood pressure management and he will see in consult. [MB]  1924 Discussed with Dr. Celine Mans critical care who will accept the patient for admission to the ICU. [MB]    Clinical Course User Index [MB] Terrilee Files, MD                             Medical Decision Making Amount and/or Complexity of Data Reviewed Labs: ordered. Radiology: ordered.  Risk Prescription drug management. Decision regarding hospitalization.   This patient complains of chest pain into the back elevated blood pressure; this involves an extensive number of treatment Options and is a complaint that carries with it a high risk of complications and morbidity. The differential includes ACS, pneumonia, vascular, PE, perforation, biliary colic  I ordered, reviewed and interpreted labs, which included CBC with low platelets normal white count, chemistries with normal renal function,  LFTs fairly unremarkable, troponins flat I ordered medication IV pain medication, IV esmolol and reviewed PMP when indicated. I ordered imaging studies which included chest x-ray, right upper quadrant ultrasound, CT angio chest abdomen pelvis and I independently    visualized and interpreted imaging which showed type B aortic dissection Additional history obtained from patient's family member  Previous records obtained and reviewed in epic no recent admissions I consulted Dr. Edilia Bo vascular surgery and Dr. Celine Mans critical care and discussed lab and imaging findings and discussed disposition.  Cardiac monitoring reviewed, normal sinus rhythm Social determinants considered, tobacco use Critical Interventions: Initiation of esmolol for type B dissection  After the interventions stated above, I reevaluated the patient and found patient's blood pressure beginning to trend down.  Admission and further testing considered, He will need admission to the hospital for further management of elevated blood pressure a consultation with vascular surgery.  Patient in agreement with plan for admission.         Final Clinical Impression(s) / ED Diagnoses Final diagnoses:  Resistant hypertension  Thrombocytopenia (HCC)  Aortic dissection distal to left subclavian [facility])    Rx / DC Orders ED Discharge Orders     None         Terrilee Files, MD 10/26/22 2131

## 2022-10-26 NOTE — ED Notes (Signed)
2nd trop sent.

## 2022-10-26 NOTE — ED Notes (Signed)
Spoke to [facility] via phone for report. 10 min ETA.

## 2022-10-26 NOTE — H&P (Addendum)
NAME:  Jaime Castillo, MRN:  [id number], DOB:  1951-03-24, LOS: 0 ADMISSION DATE:  10/26/2022, CONSULTATION DATE:  10/26/22 REFERRING MD:  Charm Barges CHIEF COMPLAINT:  Chest Pain   History of Present Illness:  Jaime Castillo is a 72 y.o. male who has a PMH as below including but not limited to HTN. He presented to [facility] 6/18 with chest pain that started 2 days prior and persisted though had somewhat improved. Had associated dyspnea at times but not constant. Denied any nausea/vomiting, fevers/chills/sweats. Pain occasionally radiated to back and abdomen. He was prescribed antihypertensives prior to COVID but one he ran out, he had trouble getting refills and subsequently stopped taking them all together. Has not been on any medications for several years now.  In ED, he was hypertensive with highest documented being 215/97. RUQ was negative. CTA chest/abd/pelv demonstrated type B aortic dissection along with severe aortic atherosclerosis and aneurysmal dilation of the infrarenal abdominal aorta measuring up to 3.4cm.  He was started on Esmolol and vascular surgery was consulted and recommended transfer to [facility] ICU for ongoing management.  PCCM subsequently called for transfer to [facility] ICU.  Pertinent  Medical History:  has TOBACCO USE; Essential hypertension, benign; BPH (benign prostatic hyperplasia); Abnormal electrocardiogram (ECG) (EKG); History of colonic polyps; Routine general medical examination at a health care facility; Allergic rhinitis; Hyperglycemia; Hyperlipidemia with target LDL less than 100; and Aortic dissection distal to left subclavian (HCC) on their problem list.  Significant Hospital Events: Including procedures, antibiotic start and stop dates in addition to other pertinent events   6/18 admit.  Interim History / Subjective:  Comfortable after morphine earlier, currently chest pain free.  Objective:  Blood pressure (!) 192/100, pulse 76, temperature 98.6 F (37 C), temperature  source Oral, resp. rate 14, height 5\' 9"  (1.753 m), weight 81.6 kg, SpO2 96 %.       No intake or output data in the 24 hours ending 10/26/22 2120 Filed Weights   10/26/22 1413  Weight: 81.6 kg    Examination: General: Adult male, resting in bed, in NAD. Neuro: A&O x 3, no deficits. HEENT: [place]/AT. Sclerae anicteric. EOMI. Cardiovascular: RRR, no M/R/G.  Lungs: Respirations even and unlabored.  CTA bilaterally, No W/R/R. Abdomen: BS x 4, soft, NT/ND.  Musculoskeletal: No gross deformities, no edema.  Skin: Intact, warm, no rashes.  Labs/imaging personally reviewed:  CTA chest/abd/pelv 6/18 > type B intramural hematoma extending just distal to takeoff of left [place] artery to the origin of the IMA, 2 right and 3 left renal arteries with intramural hematoma causing severe narrowing at origin of superior L renal artery, occlusion of inferior L renal artery, severe narrowing of inferior right renal artery, severe aortic atherosclerosis, aneurysmal dilation of infrarenal abdominal aorta measuring up to 3.4 cm.  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 Infrarenal abdominal aortic aneurysm. - Vascular surgery following, appreciate the assistance - Continue Esmolol gtt, goal SBP <120 and HR <60 for now. - Further recs regarding repeat imaging and possible interventions per vascular, likely repeat CTA in 48 hours. - Morphine PRN pain. - Follow BMP to assess renal function/perfusion. - Monitor strict I/O's.  Hypertensive emergency. Hx HTN - previously on meds but has not been on any for the past few years after not getting refills prior to COVID. - Continue Esmolol, goal SBP <120 and HR <  60 for now. - Will need home antihypertensive regimen and close follow up with PCP.   Best practice (evaluated daily):  Diet/type: clear liquids for now but probably ok  for heart healthy in AM DVT prophylaxis: prophylactic heparin  GI prophylaxis: N/A Lines: N/A Foley:  Yes, and it is still needed Code Status:  full code Last date of multidisciplinary goals of care discussion: None yet.  Labs   CBC: Recent Labs  Lab 10/26/22 1424  WBC 9.9  HGB 15.0  HCT 44.6  MCV 92.0  PLT 102*    Basic Metabolic Panel: Recent Labs  Lab 10/26/22 1424  NA 135  K 3.9  CL 102  CO2 22  GLUCOSE 128*  BUN 16  CREATININE 0.97  CALCIUM 9.5   GFR: Estimated Creatinine Clearance: 70.9 mL/min (by C-G formula based on SCr of 0.97 mg/dL). Recent Labs  Lab 10/26/22 1424  WBC 9.9    Liver Function Tests: Recent Labs  Lab 10/26/22 1424  AST 20  ALT 11  ALKPHOS 61  BILITOT 1.4*  PROT 7.5  ALBUMIN 4.7   Recent Labs  Lab 10/26/22 1424  LIPASE 14   No results for input(s): "AMMONIA" in the last 168 hours.  ABG No results found for: "PHART", "PCO2ART", "PO2ART", "HCO3", "TCO2", "ACIDBASEDEF", "O2SAT"   Coagulation Profile: No results for input(s): "INR", "PROTIME" in the last 168 hours.  Cardiac Enzymes: No results for input(s): "CKTOTAL", "CKMB", "CKMBINDEX", "TROPONINI" in the last 168 hours.  HbA1C: Hgb A1c MFr Bld  Date/Time Value Ref Range Status  10/19/2017 12:25 PM 6.1 4.6 - 6.5 % Final    Comment:    Glycemic Control Guidelines for People with Diabetes:Non Diabetic:  <6%Goal of Therapy: <7%Additional Action Suggested:  >8%   08/10/2016 03:02 PM 6.1 4.6 - 6.5 % Final    Comment:    Glycemic Control Guidelines for People with Diabetes:Non Diabetic:  <6%Goal of Therapy: <7%Additional Action Suggested:  >8%     CBG: No results for input(s): "GLUCAP" in the last 168 hours.  Review of Systems:   All negative; except for those that are bolded, which indicate positives.  Constitutional: weight loss, weight gain, night sweats, fevers, chills, fatigue, weakness.  HEENT: headaches, sore throat, sneezing, nasal congestion, post nasal  drip, difficulty swallowing, tooth/dental problems, visual complaints, visual changes, ear aches. Neuro: difficulty with speech, weakness, numbness, ataxia. CV:  chest pain occasionally radiating to back and abdomen, orthopnea, PND, swelling in lower extremities, dizziness, palpitations, syncope.  Resp: cough, hemoptysis, dyspnea (intermittent), wheezing. GI: heartburn, indigestion, abdominal pain, nausea, vomiting, diarrhea, constipation, change in bowel habits, loss of appetite, hematemesis, melena, hematochezia.  GU: dysuria, change in color of urine, urgency or frequency, flank pain, hematuria. MSK: joint pain or swelling, decreased range of motion. Psych: change in mood or affect, depression, anxiety, suicidal ideations, homicidal ideations. Skin: rash, itching, bruising.   Past Medical History:  He,  has a past medical history of Colonic polyp and Hypertension.   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 He has a 25.00 pack-year smoking history. He has never used smokeless tobacco. He reports that he does not drink alcohol and does not use drugs.   Family History:  His family history includes Cancer in an other family member.   Allergies Allergies  Allergen Reactions   Amoxil [Amoxicillin] Hives    itching     Home Medications  Prior to Admission medications   Medication Sig Start Date End Date Taking? Authorizing Provider  aspirin EC 81 MG tablet Take 1  tablet (81 mg total) by mouth daily. 10/19/17   Etta Grandchild, MD  olmesartan (BENICAR) 20 MG tablet TAKE 1 TABLET BY MOUTH EVERY DAY IN THE MORNING 10/18/18   Etta Grandchild, MD  rosuvastatin (CRESTOR) 10 MG tablet Take 1 tablet (10 mg total) by mouth daily. 10/19/17   Etta Grandchild, MD     Critical care time: 40 min.   Rutherford Guys, PA - [facility] For pager details, please see AMION or use Epic chat  After 1900, please call [facility] for cross coverage needs 10/26/2022, 9:20 PM

## 2022-10-26 NOTE — ED Notes (Signed)
Pt alert and oriented x 4, son sitting bedside, spoke to MD butler about titration of esmolol, follow MAR for titration times. Pt denies symptoms associated with HTN but endorses abd and back pain that has cont since Sunday afternoon. Pt and family updated about plan of care and pending transfer with [facility] to [facility].

## 2022-10-26 NOTE — ED Triage Notes (Signed)
Patient here POV from Home.  Endorses consistent CP since Sunday. Somewhat better since. Some SOB at times. Mid Center/Epigastric. No N/V. No Fever.  NAD Noted during Triage. A&Ox4. GCS 15. Ambulatory.

## 2022-10-27 ENCOUNTER — Inpatient Hospital Stay (HOSPITAL_COMMUNITY): Payer: No Typology Code available for payment source

## 2022-10-27 DIAGNOSIS — I71019 Dissection of thoracic aorta, unspecified: Secondary | ICD-10-CM | POA: Diagnosis not present

## 2022-10-27 LAB — BASIC METABOLIC PANEL
Anion gap: 11 (ref 5–15)
BUN: 17 mg/dL (ref 8–23)
CO2: 21 mmol/L — ABNORMAL LOW (ref 22–32)
Calcium: 8.8 mg/dL — ABNORMAL LOW (ref 8.9–10.3)
Chloride: 101 mmol/L (ref 98–111)
Creatinine, Ser: 1.18 mg/dL (ref 0.61–1.24)
GFR, Estimated: >60 mL/min (ref >60)
Glucose, Bld: 112 mg/dL — ABNORMAL HIGH (ref 70–99)
Potassium: 3.8 mmol/L (ref 3.5–5.1)
Sodium: 133 mmol/L — ABNORMAL LOW (ref 135–145)

## 2022-10-27 LAB — CBC
HCT: 43.6 % (ref 39.0–52.0)
Hemoglobin: 14.7 g/dL (ref 13.0–17.0)
MCH: 31.5 pg (ref 26.0–34.0)
MCHC: 33.7 g/dL (ref 30.0–36.0)
MCV: 93.6 fL (ref 80.0–100.0)
Platelets: 100 10*3/uL — ABNORMAL LOW (ref 150–400)
RBC: 4.66 MIL/uL (ref 4.22–5.81)
RDW: 13.9 % (ref 11.5–15.5)
WBC: 10.1 10*3/uL (ref 4.0–10.5)
nRBC: 0 % (ref 0.0–0.2)

## 2022-10-27 LAB — MAGNESIUM: Magnesium: 1.8 mg/dL (ref 1.7–2.4)

## 2022-10-27 LAB — GLUCOSE, CAPILLARY: Glucose-Capillary: 104 mg/dL — ABNORMAL HIGH (ref 70–99)

## 2022-10-27 LAB — PHOSPHORUS: Phosphorus: 2.7 mg/dL (ref 2.5–4.6)

## 2022-10-27 MED ORDER — HYDRALAZINE HCL 20 MG/ML IJ SOLN
10.0000 mg | INTRAMUSCULAR | Status: DC | PRN
  Administered 2022-10-27 – 2022-10-30 (×5): 10 mg via INTRAVENOUS
  Filled 2022-10-27 (×5): qty 1

## 2022-10-27 MED ORDER — ROSUVASTATIN CALCIUM 5 MG PO TABS
10.0000 mg | ORAL_TABLET | Freq: Every day | ORAL | Status: DC
  Administered 2022-10-27 – 2022-10-30 (×4): 10 mg via ORAL
  Filled 2022-10-27 (×4): qty 2

## 2022-10-27 MED ORDER — ASPIRIN 81 MG PO CHEW
81.0000 mg | CHEWABLE_TABLET | Freq: Every day | ORAL | Status: DC
  Administered 2022-10-27 – 2022-10-30 (×4): 81 mg via ORAL
  Filled 2022-10-27 (×4): qty 1

## 2022-10-27 MED ORDER — IRBESARTAN 150 MG PO TABS
150.0000 mg | ORAL_TABLET | Freq: Every day | ORAL | Status: DC
  Administered 2022-10-27 – 2022-10-28 (×2): 150 mg via ORAL
  Filled 2022-10-27 (×2): qty 1

## 2022-10-27 NOTE — Progress Notes (Signed)
  VASCULAR SURGERY ASSESSMENT & PLAN:   INTRAMURAL HEMATOMA THORACIC AORTA AND FOCAL INFRARENAL AORTIC DISSECTION: The patient's pain has resolved now that his blood pressure is under good control.  I will plan on a follow-up scan Thursday or Friday.  His renal function is normal.  SUBJECTIVE:   No complaints this morning.  PHYSICAL EXAM:   Vitals:   10/27/22 0600 10/27/22 0615 10/27/22 0630 10/27/22 0645  BP: 134/72 127/68 108/63 138/65  Pulse: (!) 55 (!) 54 (!) 55 (!) 54  Resp: (!) 22 15 19  (!) 21  Temp:      TempSrc:      SpO2: 95% 95% 94% 97%  Weight:      Height:       Lungs clear. Palpable pedal pulses.  LABS:   Lab Results  Component Value Date   WBC 10.1 10/27/2022   HGB 14.7 10/27/2022   HCT 43.6 10/27/2022   MCV 93.6 10/27/2022   PLT 100 (L) 10/27/2022   Lab Results  Component Value Date   CREATININE 1.18 10/27/2022   PROBLEM LIST:    Principal Problem:   Aortic dissection distal to left subclavian [facility])   CURRENT MEDS:    amLODipine  10 mg Oral Daily   Chlorhexidine Gluconate Cloth  6 each Topical Daily   heparin  5,000 Units Subcutaneous Q8H   nicotine  14 mg Transdermal Q0600    Waverly Ferrari Office: [phone] 10/27/2022

## 2022-10-27 NOTE — Progress Notes (Signed)
eLink Physician-Brief Progress Note Patient Name: Jaime Castillo DOB: 1950-08-25 MRN: [id number]   Date of Service  10/27/2022  HPI/Events of Note  Patient maxed out on Esmolol gtt at 300, BP 137/71, goal BP is < 120 mmHg. He is scheduled to receive Norvasc dose in the AM.  eICU Interventions  Nursing communication sent to bedside RN giving okay for patient to receive scheduled AM dose of Norvasc now.        Migdalia Dk 10/27/2022, 4:37 AM

## 2022-10-27 NOTE — TOC Initial Note (Signed)
Transition of Care [facility]) - Initial/Assessment Note    Patient Details  Name: Jaime Castillo MRN: [id number] Date of Birth: 1950-11-20  Transition of Care [facility]) CM/SW Contact:    Elliot Cousin, RN Phone Number: [phone] 10/27/2022, 4:19 PM  Clinical Narrative:  TOC CM spoke to pt and gave permission to speak to wife. Contacted pt's wife and states he was independent at home PTA. Has no DME. Explained PT/OT will evaluate and make recommendations.  Will continue to follow for dc needs.                Expected Discharge Plan: Home w Home Health Services Barriers to Discharge: Continued Medical Work up   Patient Goals and CMS Choice Patient states their goals for this hospitalization and ongoing recovery are:: wants patient to recover          Expected Discharge Plan and Services       Living arrangements for the past 2 months: Single Family Home                                      Prior Living Arrangements/Services Living arrangements for the past 2 months: Single Family Home Lives with:: Spouse Patient language and need for interpreter reviewed:: Yes Do you feel safe going back to the place where you live?: Yes      Need for Family Participation in Patient Care: Yes (Comment) Care giver support system in place?: Yes (comment)   Criminal Activity/Legal Involvement Pertinent to Current Situation/Hospitalization: No - Comment as needed  Activities of Daily Living      Permission Sought/Granted Permission sought to share information with : Case Manager, Family Supports, PCP Permission granted to share information with : Yes, Verbal Permission Granted  Share Information with NAME: Yordany Synder     Permission granted to share info w Relationship: wife  Permission granted to share info w Contact Information: [phone]  Emotional Assessment Appearance:: Appears stated age Attitude/Demeanor/Rapport: Lethargic Affect (typically observed):  Pleasant Orientation: : Oriented to Self, Oriented to Place, Oriented to  Time, Oriented to Situation   Psych Involvement: No (comment)  Admission diagnosis:  Thrombocytopenia (HCC) [D69.6] Aortic dissection distal to left subclavian (HCC) [I71.019] Nonspecific chest pain [R07.9] 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] Patient Active Problem List   Diagnosis Date Noted   Aortic dissection distal to left subclavian (HCC) 10/26/2022   Hyperlipidemia with target LDL less than 100 08/10/2016   Allergic rhinitis 08/09/2013   Hyperglycemia 08/09/2013   Routine general medical examination at a health care facility 08/08/2012   Abnormal electrocardiogram (ECG) (EKG) 09/11/2008   TOBACCO USE 08/28/2008   Essential hypertension, benign 08/28/2008   BPH (benign prostatic hyperplasia) 08/28/2008   History of colonic polyps 08/28/2008   PCP:  Etta Grandchild, MD Pharmacy:   [id number] - [place], [place] - [address] [place] [address] Phone: [phone] Fax: [phone]  [id number] [place] [facility] - [address] [place] [address] Phone: [phone] Fax: [phone]     Social Determinants of Health (SDOH) Social History: SDOH Screenings   Tobacco Use: High Risk (10/26/2022)   SDOH Interventions:     Readmission Risk Interventions     No data to display

## 2022-10-27 NOTE — H&P (Signed)
NAME:  Jaime Castillo, MRN:  [id number], DOB:  03/22/1951, LOS: 1 ADMISSION DATE:  10/26/2022, CONSULTATION DATE:  10/26/22 REFERRING MD:  Charm Barges CHIEF COMPLAINT:  Chest Pain   History of Present Illness:  Jaime Castillo is a 72 y.o. male who has a PMH as below including but not limited to HTN. He presented to [facility] 6/18 with chest pain that started 2 days prior and persisted though had somewhat improved. Had associated dyspnea at times but not constant. Denied any nausea/vomiting, fevers/chills/sweats. Pain occasionally radiated to back and abdomen. He was prescribed antihypertensives prior to COVID but one he ran out, he had trouble getting refills and subsequently stopped taking them all together. Has not been on any medications for several years now.  In ED, he was hypertensive with highest documented being 215/97. RUQ was negative. CTA chest/abd/pelv demonstrated type B aortic dissection along with severe aortic atherosclerosis and aneurysmal dilation of the infrarenal abdominal aorta measuring up to 3.4cm.  He was started on Esmolol and vascular surgery was consulted and recommended transfer to [facility] ICU for ongoing management.  PCCM subsequently called for transfer to [facility] ICU.  Pertinent  Medical History:  has TOBACCO USE; Essential hypertension, benign; BPH (benign prostatic hyperplasia); Abnormal electrocardiogram (ECG) (EKG); History of colonic polyps; Routine general medical examination at a health care facility; Allergic rhinitis; Hyperglycemia; Hyperlipidemia with target LDL less than 100; and Aortic dissection distal to left subclavian (HCC) on their problem list. 45 pack year smoking history.   Significant Hospital Events: Including procedures, antibiotic start and stop dates in addition to other pertinent events   6/18 admit. 6/19 pain resolved.   Interim History / Subjective:   Currently pain free.   Objective:  Blood pressure 138/65, pulse (!) 54, temperature 98.3 F (36.8  C), temperature source Oral, resp. rate (!) 21, height 5\' 9"  (1.753 m), weight 80.8 kg, SpO2 97 %.        Intake/Output Summary (Last 24 hours) at 10/27/2022 0818 Last data filed at 10/27/2022 0600 Gross per 24 hour  Intake 613.43 ml  Output 150 ml  Net 463.43 ml   Filed Weights   10/26/22 1413 10/26/22 2100 10/27/22 0500  Weight: 81.6 kg 78.2 kg 80.8 kg    Examination: General: Adult male, resting in bed, in NAD. Neuro: A&O x 3, no deficits. HEENT: [place]/AT. Sclerae anicteric. EOMI. Cardiovascular: RRR, no M/R/G.  Lungs: Respirations even and unlabored.  CTA bilaterally, No W/R/R. Abdomen: BS x 4, soft, NT/ND.  Musculoskeletal: No gross deformities, no edema.  Skin: Intact, warm, no rashes.  Labs/imaging personally reviewed:  CTA chest/abd/pelv 6/18 > type B intramural hematoma extending just distal to takeoff of left [place] artery to the origin of the IMA, 2 right and 3 left renal arteries with intramural hematoma causing severe narrowing at origin of superior L renal artery, occlusion of inferior L renal artery, severe narrowing of inferior right renal artery, severe aortic atherosclerosis, aneurysmal dilation of infrarenal abdominal aorta measuring up to 3.4 cm. 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 Infrarenal abdominal aortic aneurysm. Tobacco abuse  Plan:  - Vascular surgery following, appreciate the assistance. Plan to re-image in 24-48h  - Continue Esmolol gtt, goal SBP <120 and HR <60 for now. - Resume oral medications.  - Morphine PRN pain. - Follow BMP to assess renal function/perfusion. - Monitor  strict I/O's. - Nicotine patch, counseled regarding smoking cessation. Patient is very motivated to quit.   Best practice (evaluated daily):   Diet/type: clear liquids for now but probably ok for heart healthy in  AM DVT prophylaxis: prophylactic heparin  GI prophylaxis: N/A Lines: N/A Foley:  N/A Code Status:  full code Last date of multidisciplinary goals of care discussion: None yet.  CRITICAL CARE Performed by: Lynnell Catalan   Total critical care time: 35 minutes  Critical care time was exclusive of separately billable procedures and treating other patients.  Critical care was necessary to treat or prevent imminent or life-threatening deterioration.  Critical care was time spent personally by me on the following activities: development of treatment plan with patient and/or surrogate as well as nursing, discussions with consultants, evaluation of patient's response to treatment, examination of patient, obtaining history from patient or surrogate, ordering and performing treatments and interventions, ordering and review of laboratory studies, ordering and review of radiographic studies, pulse oximetry, re-evaluation of patient's condition and participation in multidisciplinary rounds.  Lynnell Catalan, MD [facility] ICU Physician [facility]  Pager: [phone] Mobile: [phone] After hours: [phone].   10/27/2022, 8:18 AM

## 2022-10-28 ENCOUNTER — Inpatient Hospital Stay (HOSPITAL_COMMUNITY): Payer: No Typology Code available for payment source

## 2022-10-28 DIAGNOSIS — I71019 Dissection of thoracic aorta, unspecified: Secondary | ICD-10-CM | POA: Diagnosis not present

## 2022-10-28 LAB — CBC
HCT: 42.1 % (ref 39.0–52.0)
Hemoglobin: 14.6 g/dL (ref 13.0–17.0)
MCH: 31.7 pg (ref 26.0–34.0)
MCHC: 34.7 g/dL (ref 30.0–36.0)
MCV: 91.5 fL (ref 80.0–100.0)
Platelets: 102 10*3/uL — ABNORMAL LOW (ref 150–400)
RBC: 4.6 MIL/uL (ref 4.22–5.81)
RDW: 13.9 % (ref 11.5–15.5)
WBC: 8.7 10*3/uL (ref 4.0–10.5)
nRBC: 0 % (ref 0.0–0.2)

## 2022-10-28 LAB — BASIC METABOLIC PANEL
Anion gap: 10 (ref 5–15)
BUN: 25 mg/dL — ABNORMAL HIGH (ref 8–23)
CO2: 21 mmol/L — ABNORMAL LOW (ref 22–32)
Calcium: 8.4 mg/dL — ABNORMAL LOW (ref 8.9–10.3)
Chloride: 104 mmol/L (ref 98–111)
Creatinine, Ser: 1.25 mg/dL — ABNORMAL HIGH (ref 0.61–1.24)
GFR, Estimated: >60 mL/min (ref >60)
Glucose, Bld: 110 mg/dL — ABNORMAL HIGH (ref 70–99)
Potassium: 3.7 mmol/L (ref 3.5–5.1)
Sodium: 135 mmol/L (ref 135–145)

## 2022-10-28 MED ORDER — HYDRALAZINE HCL 10 MG PO TABS
10.0000 mg | ORAL_TABLET | Freq: Three times a day (TID) | ORAL | Status: DC
  Administered 2022-10-28 – 2022-10-29 (×3): 10 mg via ORAL
  Filled 2022-10-28 (×3): qty 1

## 2022-10-28 MED ORDER — IRBESARTAN 300 MG PO TABS
300.0000 mg | ORAL_TABLET | Freq: Every day | ORAL | Status: DC
  Administered 2022-10-29 – 2022-10-30 (×2): 300 mg via ORAL
  Filled 2022-10-28 (×2): qty 1

## 2022-10-28 MED ORDER — IRBESARTAN 150 MG PO TABS
150.0000 mg | ORAL_TABLET | Freq: Once | ORAL | Status: AC
  Administered 2022-10-28: 150 mg via ORAL
  Filled 2022-10-28: qty 1

## 2022-10-28 MED ORDER — HYDROCHLOROTHIAZIDE 25 MG PO TABS
25.0000 mg | ORAL_TABLET | Freq: Every day | ORAL | Status: DC
  Administered 2022-10-28 – 2022-10-30 (×3): 25 mg via ORAL
  Filled 2022-10-28 (×3): qty 1

## 2022-10-28 MED ORDER — IOHEXOL 350 MG/ML SOLN
100.0000 mL | Freq: Once | INTRAVENOUS | Status: AC | PRN
  Administered 2022-10-28: 100 mL via INTRAVENOUS

## 2022-10-28 MED ORDER — CLEVIDIPINE BUTYRATE 0.5 MG/ML IV EMUL
INTRAVENOUS | Status: AC
  Administered 2022-10-28: 2 mg/h via INTRAVENOUS
  Filled 2022-10-28: qty 100

## 2022-10-28 MED ORDER — CLEVIDIPINE BUTYRATE 0.5 MG/ML IV EMUL
0.0000 mg/h | INTRAVENOUS | Status: DC
  Administered 2022-10-28 – 2022-10-29 (×3): 10 mg/h via INTRAVENOUS
  Administered 2022-10-29: 8 mg/h via INTRAVENOUS
  Filled 2022-10-28 (×4): qty 100

## 2022-10-28 MED ORDER — METOPROLOL TARTRATE 50 MG PO TABS
50.0000 mg | ORAL_TABLET | Freq: Two times a day (BID) | ORAL | Status: DC
  Administered 2022-10-28 – 2022-10-30 (×5): 50 mg via ORAL
  Filled 2022-10-28 (×5): qty 1

## 2022-10-28 NOTE — Progress Notes (Addendum)
  VASCULAR SURGERY ASSESSMENT & PLAN:   INTRAMURAL HEMATOMA THORACIC AORTA AND FOCAL INFRARENAL AORTIC DISSECTION: The patient's pain has resolved now that his blood pressure is under good control.  I have ordered a follow-up CT scan for today.  ADDENDUM: I reviewed the images of the CT angiogram today.  This shows no significant change in his intramural hematoma and focal aortic dissection.  SUBJECTIVE:   No chest pain or back pain.  PHYSICAL EXAM:   Vitals:   10/28/22 0615 10/28/22 0630 10/28/22 0645 10/28/22 0705  BP: (!) 146/74 123/75 136/62 126/60  Pulse:  67 68 65  Resp:  (!) 21 20 19   Temp:      TempSrc:      SpO2:  96% 96% 95%  Weight:      Height:       Lungs clear.  LABS:   Lab Results  Component Value Date   WBC 8.7 10/28/2022   HGB 14.6 10/28/2022   HCT 42.1 10/28/2022   MCV 91.5 10/28/2022   PLT 102 (L) 10/28/2022   Lab Results  Component Value Date   CREATININE 1.25 (H) 10/28/2022   No results found for: "INR", "PROTIME" CBG (last 3)  Recent Labs    10/26/22 2056  GLUCAP 104*    PROBLEM LIST:    Principal Problem:   Aortic dissection distal to left subclavian [facility])   CURRENT MEDS:    amLODipine  10 mg Oral Daily   aspirin  81 mg Oral Daily   Chlorhexidine Gluconate Cloth  6 each Topical Daily   heparin  5,000 Units Subcutaneous Q8H   irbesartan  150 mg Oral Daily   metoprolol tartrate  50 mg Oral BID   nicotine  14 mg Transdermal Q0600   rosuvastatin  10 mg Oral Daily    Waverly Ferrari Office: [phone] 10/28/2022

## 2022-10-28 NOTE — H&P (Signed)
NAME:  Jaime Castillo, MRN:  [id number], DOB:  [birth year], LOS: 2 ADMISSION DATE:  10/26/2022, CONSULTATION DATE:  10/26/22 REFERRING MD:  Charm Barges CHIEF COMPLAINT:  Chest Pain   History of Present Illness:  Jaime Castillo is a 72 y.o. male who has a PMH as below including but not limited to HTN. He presented to [facility] 6/18 with chest pain that started 2 days prior and persisted though had somewhat improved. Had associated dyspnea at times but not constant. Denied any nausea/vomiting, fevers/chills/sweats. Pain occasionally radiated to back and abdomen. He was prescribed antihypertensives prior to COVID but one he ran out, he had trouble getting refills and subsequently stopped taking them all together. Has not been on any medications for several years now.  In ED, he was hypertensive with highest documented being 215/97. RUQ was negative. CTA chest/abd/pelv demonstrated type B aortic dissection along with severe aortic atherosclerosis and aneurysmal dilation of the infrarenal abdominal aorta measuring up to 3.4cm.  He was started on Esmolol and vascular surgery was consulted and recommended transfer to [facility] ICU for ongoing management.  PCCM subsequently called for transfer to [facility] ICU.  Pertinent  Medical History:  has TOBACCO USE; Essential hypertension, benign; BPH (benign prostatic hyperplasia); Abnormal electrocardiogram (ECG) (EKG); History of colonic polyps; Routine general medical examination at a health care facility; Allergic rhinitis; Hyperglycemia; Hyperlipidemia with target LDL less than 100; and Aortic dissection distal to left subclavian (HCC) on their problem list. 45 pack year smoking history.   Significant Hospital Events: Including procedures, antibiotic start and stop dates in addition to other pertinent events   6/18 admit. 6/19 pain resolved.   Interim History / Subjective:   Currently pain free.  Still requiring low dose esmolol.   Objective:  Blood pressure 132/66, pulse  63, temperature 99.2 F (37.3 C), temperature source Oral, resp. rate 17, height 5\' 9"  (1.753 m), weight 82.1 kg, SpO2 98 %.        Intake/Output Summary (Last 24 hours) at 10/28/2022 0932 Last data filed at 10/28/2022 0800 Gross per 24 hour  Intake 721.36 ml  Output 1575 ml  Net -853.64 ml    Filed Weights   10/26/22 2100 10/27/22 0500 10/28/22 0500  Weight: 78.2 kg 80.8 kg 82.1 kg    Examination: General: Adult male, resting in bed, in NAD. Neuro: A&O x 3, no deficits. HEENT: [place]/AT. Sclerae anicteric. EOMI. Cardiovascular: RRR, no M/R/G.  Lungs: Respirations even and unlabored.  CTA bilaterally, No W/R/R. Abdomen: BS x 4, soft, NT/ND.  Musculoskeletal: No gross deformities, no edema.  Skin: Intact, warm, no rashes.  Labs/imaging personally reviewed:  CTA chest/abd/pelv 6/18 > type B intramural hematoma extending just distal to takeoff of left [place] artery to the origin of the IMA, 2 right and 3 left renal arteries with intramural hematoma causing severe narrowing at origin of superior L renal artery, occlusion of inferior L renal artery, severe narrowing of inferior right renal artery, severe aortic atherosclerosis, aneurysmal dilation of infrarenal abdominal aorta measuring up to 3.4 cm. 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 Infrarenal abdominal aortic aneurysm. Tobacco abuse AKI  Plan:  - Vascular surgery following, appreciate the assistance. Plan to re-image today - Continue Esmolol gtt, goal SBP <120 and HR <60 for now. - On irbesartan and amlodipine, add metoprolol.  - Morphine PRN pain. - Follow  BMP to assess renal function/perfusion. - Monitor strict I/O's. - Nicotine patch, counseled regarding smoking cessation. Patient is very motivated to quit.  - If BP well controlled and imaging favorable, may be ready  for discharge as early as tomorrow.   Best practice (evaluated daily):   Diet/type: heart healthy DVT prophylaxis: prophylactic heparin  GI prophylaxis: N/A Lines: N/A Foley:  N/A Code Status:  full code Last date of multidisciplinary goals of care discussion: None yet.   Lynnell Catalan, MD [facility] ICU Physician [facility]  Pager: [phone] Mobile: [phone] After hours: [phone].   10/28/2022, 9:32 AM

## 2022-10-29 ENCOUNTER — Inpatient Hospital Stay (HOSPITAL_COMMUNITY): Payer: No Typology Code available for payment source

## 2022-10-29 DIAGNOSIS — F17200 Nicotine dependence, unspecified, uncomplicated: Secondary | ICD-10-CM

## 2022-10-29 DIAGNOSIS — N179 Acute kidney failure, unspecified: Secondary | ICD-10-CM | POA: Diagnosis not present

## 2022-10-29 DIAGNOSIS — I1A0 Resistant hypertension: Secondary | ICD-10-CM

## 2022-10-29 DIAGNOSIS — I71019 Dissection of thoracic aorta, unspecified: Secondary | ICD-10-CM | POA: Diagnosis not present

## 2022-10-29 DIAGNOSIS — R079 Chest pain, unspecified: Secondary | ICD-10-CM

## 2022-10-29 LAB — ECHOCARDIOGRAM COMPLETE: Height: 69 in

## 2022-10-29 MED ORDER — SPIRONOLACTONE 12.5 MG HALF TABLET
12.5000 mg | ORAL_TABLET | Freq: Every day | ORAL | Status: DC
  Administered 2022-10-29 – 2022-10-30 (×2): 12.5 mg via ORAL
  Filled 2022-10-29 (×2): qty 1

## 2022-10-29 MED ORDER — CHLORTHALIDONE 25 MG PO TABS
25.0000 mg | ORAL_TABLET | Freq: Every day | ORAL | Status: DC
  Administered 2022-10-29 – 2022-10-30 (×2): 25 mg via ORAL
  Filled 2022-10-29 (×2): qty 1

## 2022-10-29 MED ORDER — POTASSIUM CHLORIDE CRYS ER 20 MEQ PO TBCR
40.0000 meq | EXTENDED_RELEASE_TABLET | Freq: Once | ORAL | Status: AC
  Administered 2022-10-29: 40 meq via ORAL
  Filled 2022-10-29: qty 2

## 2022-10-29 NOTE — Progress Notes (Addendum)
NAME:  Jaime Castillo, MRN:  [id number], DOB:  Aug 27, 1950, LOS: 3 ADMISSION DATE:  10/26/2022, CONSULTATION DATE:  10/26/22 REFERRING MD:  Charm Barges CHIEF COMPLAINT:  Chest Pain   History of Present Illness:  Jaime Castillo is a 72 y.o. male who has a PMH as below including but not limited to HTN. He presented to [facility] 6/18 with chest pain that started 2 days prior and persisted though had somewhat improved. Had associated dyspnea at times but not constant. Denied any nausea/vomiting, fevers/chills/sweats. Pain occasionally radiated to back and abdomen. He was prescribed antihypertensives prior to COVID but one he ran out, he had trouble getting refills and subsequently stopped taking them all together. Has not been on any medications for several years now.  In ED, he was hypertensive with highest documented being 215/97. RUQ was negative. CTA chest/abd/pelv demonstrated type B aortic dissection along with severe aortic atherosclerosis and aneurysmal dilation of the infrarenal abdominal aorta measuring up to 3.4cm.  He was started on Esmolol and vascular surgery was consulted and recommended transfer to [facility] ICU for ongoing management.  PCCM subsequently called for transfer to [facility] ICU.  Pertinent  Medical History:  has TOBACCO USE; Essential hypertension, benign; BPH (benign prostatic hyperplasia); Abnormal electrocardiogram (ECG) (EKG); History of colonic polyps; Routine general medical examination at a health care facility; Allergic rhinitis; Hyperglycemia; Hyperlipidemia with target LDL less than 100; and Aortic dissection distal to left subclavian (HCC) on their problem list. 45 pack year smoking history.   Significant Hospital Events: Including procedures, antibiotic start and stop dates in addition to other pertinent events   6/18 admit. 6/19 pain resolved. 6/21: Remains on clevidipine infusion  Interim History / Subjective:  Patient is stated chest pain and abdominal pain has  resolved Continues to require clevidipine infusion at 10 mg/h  Objective:  Blood pressure 129/67, pulse 70, temperature 98.8 F (37.1 C), temperature source Oral, resp. rate (!) 21, height 5\' 9"  (1.753 m), weight 80.5 kg, SpO2 91 %.        Intake/Output Summary (Last 24 hours) at 10/29/2022 0809 Last data filed at 10/29/2022 0700 Gross per 24 hour  Intake 543.99 ml  Output 1450 ml  Net -906.01 ml   Filed Weights   10/27/22 0500 10/28/22 0500 10/29/22 0500  Weight: 80.8 kg 82.1 kg 80.5 kg    Examination: General: Elderly male, lying on the bed HEENT: [place]/AT, eyes anicteric.  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 Infrarenal abdominal aortic aneurysm. Tobacco dependence Resistant hypertension AKI Hyponatremia, resolved Appreciate vascular surgery follow-up, repeat imaging showed stable type B aortic dissection and intramural hematoma of thoracic aorta Suggested continue conservative management with maintaining blood pressure and heart rate Esmolol infusion was discontinued Blood pressure still not well-controlled, requiring clevidipine infusion currently at 10 mg Continue amlodipine, irbesartan, chlorthalidone Patient has resistant hypertension, will start spironolactone Counseling provided regarding quit smoking, he is determined not to restart smoking again Continue nicotine patch to prevent nicotine withdrawal Serum creatinine slightly trended up to 1.25 Monitor intake and output Avoid nephrotoxic agents  Best  practice (evaluated daily):  Diet/type: heart healthy DVT prophylaxis: prophylactic heparin  GI prophylaxis: N/A Lines: N/A Foley:  N/A Code Status:  full code Last date of multidisciplinary goals of care discussion: None yet.   The patient is critically ill due to type B aortic dissection, resistant hypertension, AKI.  Critical care was necessary to treat or prevent imminent or life-threatening deterioration.  Critical care was time spent personally by me on the following activities: development of treatment plan with patient and/or surrogate as well as nursing, discussions with consultants, evaluation of patient's response to treatment, examination of patient, obtaining history from patient or surrogate, ordering and performing treatments and interventions, ordering and review of laboratory studies, ordering and review of radiographic studies, pulse oximetry, re-evaluation of patient's condition and participation in multidisciplinary rounds.   During this encounter critical care time was devoted to patient care services described in this note for 32 minutes.   Cheri Fowler, MD  Pulmonary Critical Care See Amion for pager If no response to pager, please call [phone] until 7pm After 7pm, Please call E-link [phone]

## 2022-10-30 ENCOUNTER — Other Ambulatory Visit (HOSPITAL_COMMUNITY): Payer: Self-pay

## 2022-10-30 DIAGNOSIS — I161 Hypertensive emergency: Secondary | ICD-10-CM

## 2022-10-30 DIAGNOSIS — I71019 Dissection of thoracic aorta, unspecified: Secondary | ICD-10-CM | POA: Diagnosis not present

## 2022-10-30 LAB — ECHOCARDIOGRAM COMPLETE
AR max vel: 3.39 cm2
AV Area VTI: 3.26 cm2
AV Area mean vel: 3.11 cm2
AV Mean grad: 6 mmHg
AV Peak grad: 11.3 mmHg
AV Vena cont: 0.6 cm
Ao pk vel: 1.68 m/s
Area-P 1/2: 4.8 cm2
Est EF: 65
P 1/2 time: 834 msec
S' Lateral: 3 cm
Weight: 2839.52 oz

## 2022-10-30 LAB — TRIGLYCERIDES: Triglycerides: 69 mg/dL (ref <150)

## 2022-10-30 MED ORDER — ROSUVASTATIN CALCIUM 10 MG PO TABS
10.0000 mg | ORAL_TABLET | Freq: Every day | ORAL | 0 refills | Status: DC
  Filled 2022-10-30: qty 30, 30d supply, fill #0

## 2022-10-30 MED ORDER — AMLODIPINE BESYLATE 10 MG PO TABS
10.0000 mg | ORAL_TABLET | Freq: Every day | ORAL | 0 refills | Status: DC
  Filled 2022-10-30: qty 30, 30d supply, fill #0

## 2022-10-30 MED ORDER — METOPROLOL TARTRATE 50 MG PO TABS
50.0000 mg | ORAL_TABLET | Freq: Two times a day (BID) | ORAL | 0 refills | Status: DC
  Filled 2022-10-30: qty 60, 30d supply, fill #0

## 2022-10-30 MED ORDER — CHLORTHALIDONE 25 MG PO TABS
25.0000 mg | ORAL_TABLET | Freq: Every day | ORAL | 0 refills | Status: DC
  Filled 2022-10-30: qty 30, 30d supply, fill #0

## 2022-10-30 MED ORDER — IRBESARTAN 300 MG PO TABS
300.0000 mg | ORAL_TABLET | Freq: Every day | ORAL | 0 refills | Status: DC
  Filled 2022-10-30: qty 30, 30d supply, fill #0

## 2022-10-30 MED ORDER — ASPIRIN 81 MG PO CHEW
81.0000 mg | CHEWABLE_TABLET | Freq: Every day | ORAL | 0 refills | Status: DC
  Filled 2022-10-30: qty 90, 90d supply, fill #0

## 2022-10-30 MED ORDER — SPIRONOLACTONE 25 MG PO TABS
12.5000 mg | ORAL_TABLET | Freq: Every day | ORAL | 0 refills | Status: DC
  Filled 2022-10-30: qty 15, 30d supply, fill #0

## 2022-10-30 MED ORDER — NICOTINE 14 MG/24HR TD PT24
14.0000 mg | MEDICATED_PATCH | Freq: Every day | TRANSDERMAL | 0 refills | Status: DC
  Filled 2022-10-30: qty 28, 28d supply, fill #0

## 2022-10-30 NOTE — Final Progress Note (Signed)
This nurse educated patient and his son at bedside. No questions/concerns for discharge and follow up. Patient de-lined and discharged in wheelchair by tech.

## 2022-10-30 NOTE — Discharge Instructions (Signed)
Please find and establish with a primary care physician you will need your blood pressure monitored as well as labs.  You will also need to follow-up with outpatient vascular surgery if they did not contact you by middle of next week please call the office Address: [address], [place], [place] [address] Phone: [phone]

## 2022-10-30 NOTE — Discharge Summary (Signed)
Physician Discharge Summary         Patient ID: Jaime Castillo MRN: [id number] DOB/AGE: [birth year] 72 y.o.  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 Infrarenal abdominal aortic aneurysm. Tobacco dependence Resistant hypertension AKI Hyponatremia, resolved  Discharge summary   Jaime Castillo is a 72 y.o. male who has a PMH as below including but not limited to HTN. He presented to [facility] 6/18 with chest pain that started 2 days prior and persisted though had somewhat improved. Had associated dyspnea at times but not constant. Denied any nausea/vomiting, fevers/chills/sweats. Pain occasionally radiated to back and abdomen.  He was prescribed antihypertensives prior to COVID but he ran out, he had trouble getting refills and subsequently stopped taking them all together. Has not been on any medications for several years now.   In ED, he was hypertensive with highest documented being 215/97. RUQ was negative. CTA chest/abd/pelv demonstrated type B aortic dissection along with severe aortic atherosclerosis and aneurysmal dilation of the infrarenal abdominal aorta measuring up to 3.4cm.   He was started on Esmolol and vascular surgery was consulted and recommended transfer to [facility] ICU for ongoing management.   Patient remained admitted until 6/22 requiring tight hemodynamic control utilizing IV continuous antihypertensives.  By a.m. of 6/22 patient remains on IV antihypertensive drips, is pain-free, and ambulating per baseline.  Therefore patient will be discharged home.  Patient will need to follow-up with outpatient vascular surgery with repeat CT in 6 weeks.    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 Infrarenal abdominal aortic aneurysm. -Appreciate vascular surgery follow-up, repeat imaging showed stable type B aortic dissection and intramural hematoma of thoracic aorta Tobacco dependence Resistant hypertension AKI Hyponatremia, resolved P: Continue amlodipine 10 mg daily, aspirin 81 mg daily, chlorthalidone 25 mg daily, Avapro 30 mg daily, Lopressor 50 mg twice daily, spironolactone 12.5 mg daily, and Crestor daily at discharge Will need to follow-up with outpatient vascular surgery and repeat CT in 6 weeks, surgical team to arrange for follow-up Encourage continued nicotine patch and tobacco cessation upon discharge Will need to follow-up with PCP for ongoing blood pressure control  Significant Hospital tests/ studies  6/18 admit. 6/19 pain resolved. 6/20 repeat CT with stable appearing type B dissection 6/21 Remains on clevidipine infusion 6/22 stable for discharge  Procedures   None  Culture data/antimicrobials   MRSA seronegative   Consults  Vascular surgery    Discharge Exam: BP 139/63 (BP Location: Left Arm)   Pulse (!) 54   Temp 97.7 F (36.5 C) (Oral)   Resp 17   Ht 5\' 9"  (1.753 m)   Wt 78.3 kg   SpO2 97%   BMI 25.49 kg/m    Labs at discharge   Lab Results  Component Value Date   CREATININE 1.25 (H) 10/28/2022   BUN 25 (H) 10/28/2022   NA 135 10/28/2022   K 3.7 10/28/2022   CL 104 10/28/2022   CO2 21 (L) 10/28/2022   Lab Results  Component Value Date   WBC 8.7 10/28/2022   HGB 14.6 10/28/2022   HCT 42.1 10/28/2022  MCV 91.5 10/28/2022   PLT 102 (L) 10/28/2022   Lab Results  Component Value Date   ALT 11 10/26/2022   AST 20 10/26/2022   ALKPHOS 61 10/26/2022   BILITOT 1.4 (H) 10/26/2022   No results found for: "INR", "PROTIME"  Current radiological studies    ECHOCARDIOGRAM  COMPLETE  Result Date: 10/30/2022    ECHOCARDIOGRAM REPORT   Patient Name:   Jaime Castillo Date of Exam: 10/29/2022 Medical Rec #:  [id number]       Height:       69.0 in Accession #:    [id number]      Weight:       177.5 lb Date of Birth:  08/05/1950        BSA:          1.964 m Patient Age:    72 years        BP:           115/68 mmHg Patient Gender: M               HR:           56 bpm. Exam Location:  Inpatient Procedure: 2D Echo, Cardiac Doppler and Color Doppler Indications:    Chest Pain R07.9  History:        Patient has no prior history of Echocardiogram examinations.                 Aortic Dissection, Signs/Symptoms:Chest Pain; Risk                 Factors:Current Smoker and Hypertension.  Sonographer:    Dondra Prader RVT RCS Referring Phys: [id number] SUDHAM CHAND IMPRESSIONS  1. Left ventricular ejection fraction, by estimation, is 65%. The left ventricle has normal function. The left ventricle has no regional wall motion abnormalities. There is mild left ventricular hypertrophy. Left ventricular diastolic parameters were normal.  2. Right ventricular systolic function is normal. The right ventricular size is normal. There is normal pulmonary artery systolic pressure. The estimated right ventricular systolic pressure is 29.4 mmHg.  3. The mitral valve is normal in structure. Trivial mitral valve regurgitation. No evidence of mitral stenosis.  4. The aortic valve is tricuspid. There is moderate calcification of the aortic valve. Aortic valve regurgitation is trivial. Aortic valve sclerosis/calcification is present, without any evidence of aortic stenosis.  5. Aortic dilatation noted. There is borderline dilatation of the ascending aorta, measuring 40 mm.  6. The inferior vena cava is normal in size with greater than 50% respiratory variability, suggesting right atrial pressure of 3 mmHg. FINDINGS  Left Ventricle: Left ventricular ejection fraction, by estimation, is 65%. The left ventricle has normal  function. The left ventricle has no regional wall motion abnormalities. The left ventricular internal cavity size was normal in size. There is mild left ventricular hypertrophy. Left ventricular diastolic parameters were normal. Right Ventricle: The right ventricular size is normal. No increase in right ventricular wall thickness. Right ventricular systolic function is normal. There is normal pulmonary artery systolic pressure. The tricuspid regurgitant velocity is 2.57 m/s, and  with an assumed right atrial pressure of 3 mmHg, the estimated right ventricular systolic pressure is 29.4 mmHg. Left Atrium: Left atrial size was normal in size. Right Atrium: Right atrial size was normal in size. Pericardium: There is no evidence of pericardial effusion. Mitral Valve: The mitral valve is normal in structure. Trivial mitral valve regurgitation. No evidence of mitral valve stenosis. Tricuspid Valve: The tricuspid  valve is normal in structure. Tricuspid valve regurgitation is trivial. No evidence of tricuspid stenosis. Aortic Valve: The aortic valve is tricuspid. There is moderate calcification of the aortic valve. Aortic valve regurgitation is trivial. Aortic regurgitation PHT measures 834 msec. Aortic valve sclerosis/calcification is present, without any evidence of aortic stenosis. Aortic valve mean gradient measures 6.0 mmHg. Aortic valve peak gradient measures 11.3 mmHg. Aortic valve area, by VTI measures 3.26 cm. Pulmonic Valve: The pulmonic valve was grossly normal. Pulmonic valve regurgitation is trivial. No evidence of pulmonic stenosis. Aorta: Aortic dilatation noted. There is borderline dilatation of the ascending aorta, measuring 40 mm. Venous: The inferior vena cava is normal in size with greater than 50% respiratory variability, suggesting right atrial pressure of 3 mmHg. IAS/Shunts: No atrial level shunt detected by color flow Doppler.  LEFT VENTRICLE PLAX 2D LVIDd:         5.30 cm   Diastology LVIDs:          3.00 cm   LV e' medial:    8.08 cm/s LV PW:         1.30 cm   LV E/e' medial:  12.1 LV IVS:        1.10 cm   LV e' lateral:   7.14 cm/s LVOT diam:     2.30 cm   LV E/e' lateral: 13.7 LV SV:         113 LV SV Index:   58 LVOT Area:     4.15 cm  RIGHT VENTRICLE             IVC RV Basal diam:  3.40 cm     IVC diam: 2.00 cm RV Mid diam:    2.40 cm RV S prime:     18.20 cm/s TAPSE (M-mode): 2.4 cm LEFT ATRIUM             Index        RIGHT ATRIUM           Index LA diam:        3.70 cm 1.88 cm/m   RA Area:     21.10 cm LA Vol (A2C):   43.5 ml 22.15 ml/m  RA Volume:   61.80 ml  31.47 ml/m LA Vol (A4C):   46.8 ml 23.83 ml/m LA Biplane Vol: 46.4 ml 23.63 ml/m  AORTIC VALVE                     PULMONIC VALVE AV Area (Vmax):    3.39 cm      PV Vmax:       0.90 m/s AV Area (Vmean):   3.11 cm      PV Peak grad:  3.3 mmHg AV Area (VTI):     3.26 cm AV Vmax:           168.00 cm/s AV Vmean:          110.000 cm/s AV VTI:            0.347 m AV Peak Grad:      11.3 mmHg AV Mean Grad:      6.0 mmHg LVOT Vmax:         137.00 cm/s LVOT Vmean:        82.300 cm/s LVOT VTI:          0.272 m LVOT/AV VTI ratio: 0.78 AI PHT:            834 msec AR Vena Contracta: 0.60 cm  AORTA Ao Root diam: 3.40 cm Ao Asc diam:  4.00 cm MITRAL VALVE               TRICUSPID VALVE MV Area (PHT): 4.80 cm    TR Peak grad:   26.4 mmHg MV Decel Time: 158 msec    TR Vmax:        257.00 cm/s MV E velocity: 98.00 cm/s MV A velocity: 62.90 cm/s  SHUNTS MV E/A ratio:  1.56        Systemic VTI:  0.27 m                            Systemic Diam: 2.30 cm Weston Brass MD Electronically signed by Weston Brass MD Signature Date/Time: 10/30/2022/5:48:03 AM    Final     Disposition:        Allergies as of 10/30/2022       Reactions   Amoxil [amoxicillin] Hives   itching        Medication List     TAKE these medications    amLODipine 10 MG tablet Commonly known as: NORVASC Take 1 tablet (10 mg total) by mouth daily. Start taking on: October 31, 2022   aspirin 81 MG chewable tablet Chew 1 tablet (81 mg total) by mouth daily. Start taking on: October 31, 2022   chlorthalidone 25 MG tablet Commonly known as: HYGROTON Take 1 tablet (25 mg total) by mouth daily. Start taking on: October 31, 2022   irbesartan 300 MG tablet Commonly known as: AVAPRO Take 1 tablet (300 mg total) by mouth daily. Start taking on: October 31, 2022   metoprolol tartrate 50 MG tablet Commonly known as: LOPRESSOR Take 1 tablet (50 mg total) by mouth 2 (two) times daily.   nicotine 14 mg/24hr patch Commonly known as: NICODERM CQ - dosed in mg/24 hours Place 1 patch (14 mg total) onto the skin daily at 6 (six) AM. Start taking on: October 31, 2022   rosuvastatin 10 MG tablet Commonly known as: CRESTOR Take 1 tablet (10 mg total) by mouth daily. Start taking on: October 31, 2022   spironolactone 25 MG tablet Commonly known as: ALDACTONE Take 0.5 tablets (12.5 mg total) by mouth daily. Start taking on: October 31, 2022         Follow-up appointment   Vascular surgery PCP Discharge Condition:    stable   Signed: Antonio Woodhams D. Harris, NP-C  Pulmonary & Critical Care Personal contact information can be found on Amion  If no contact or response made please call [phone] 10/30/2022, 12:35 PM

## 2022-10-30 NOTE — Progress Notes (Signed)
  VASCULAR SURGERY ASSESSMENT & PLAN:   INTRAMURAL HEMATOMA OF THORACIC AORTA WITH FOCAL INFRARENAL AORTIC DISSECTION: He is pain-free.  His blood pressure has been under good control.  He is off intravenous antihypertensive agents.  Hopefully for discharge today.  I have arranged an office visit and follow-up CT scan in 6 weeks.  SUBJECTIVE:   No complaints this morning.  He is anxious to go home.  PHYSICAL EXAM:   Vitals:   10/30/22 0340 10/30/22 0400 10/30/22 0500 10/30/22 0600  BP:  (!) 148/74 (!) 156/69 134/62  Pulse:  62 61 63  Resp:  20 19 (!) 21  Temp: 99.2 F (37.3 C)     TempSrc: Oral     SpO2:  92% 93% 94%  Weight:   78.3 kg   Height:       Lungs clear.  LABS:   Lab Results  Component Value Date   WBC 8.7 10/28/2022   HGB 14.6 10/28/2022   HCT 42.1 10/28/2022   MCV 91.5 10/28/2022   PLT 102 (L) 10/28/2022   Lab Results  Component Value Date   CREATININE 1.25 (H) 10/28/2022   PROBLEM LIST:    Principal Problem:   Aortic dissection distal to left subclavian [facility])   CURRENT MEDS:    amLODipine  10 mg Oral Daily   aspirin  81 mg Oral Daily   Chlorhexidine Gluconate Cloth  6 each Topical Daily   chlorthalidone  25 mg Oral Daily   heparin  5,000 Units Subcutaneous Q8H   hydrochlorothiazide  25 mg Oral Daily   irbesartan  300 mg Oral Daily   metoprolol tartrate  50 mg Oral BID   nicotine  14 mg Transdermal Q0600   rosuvastatin  10 mg Oral Daily   spironolactone  12.5 mg Oral Daily    Waverly Ferrari Office: [phone] 10/30/2022

## 2022-11-04 ENCOUNTER — Other Ambulatory Visit (HOSPITAL_COMMUNITY): Payer: Self-pay

## 2022-11-06 ENCOUNTER — Other Ambulatory Visit (HOSPITAL_COMMUNITY): Payer: Self-pay

## 2022-11-10 ENCOUNTER — Other Ambulatory Visit (HOSPITAL_COMMUNITY): Payer: Self-pay

## 2022-11-17 ENCOUNTER — Other Ambulatory Visit: Payer: Self-pay

## 2022-11-17 DIAGNOSIS — I71019 Dissection of thoracic aorta, unspecified: Secondary | ICD-10-CM

## 2022-11-17 NOTE — Progress Notes (Signed)
New Patient Office Visit  Subjective:   Jaime Castillo 30-May-1950 11/18/2022  No chief complaint on file.   HPI: Jaime Castillo presents today to establish care at [facility] at [facility]. Introduced to Publishing rights manager role and practice setting.  All questions answered.   Last PCP: *** Last annual physical: *** Concerns: See below   HOSPITAL FOLLOW UP:  Jaime Castillo presents for hospital follow up.  Hospital/Facility: ***  Discharge Diagnosis: *** Admission Date: *** Discharge Date: *** TCM call Date: ***  New Medications: *** Discontinued Medications: ***  Discharge Instructions: *** Referrals: ***     The following portions of the patient's history were reviewed and updated as appropriate: past medical history, past surgical history, family history, social history, allergies, medications, and problem list.   Patient Active Problem List   Diagnosis Date Noted   Aortic dissection distal to left subclavian (HCC) 10/26/2022   Hyperlipidemia with target LDL less than 100 08/10/2016   Allergic rhinitis 08/09/2013   Hyperglycemia 08/09/2013   Routine general medical examination at a health care facility 08/08/2012   Abnormal electrocardiogram (ECG) (EKG) 09/11/2008   TOBACCO USE 08/28/2008   Essential hypertension, benign 08/28/2008   BPH (benign prostatic hyperplasia) 08/28/2008   History of colonic polyps 08/28/2008   Past Medical History:  Diagnosis Date   Colonic polyp    Hypertension    Past Surgical History:  Procedure Laterality Date   COLONOSCOPY WITH PROPOFOL N/A 10/17/2012   Procedure: COLONOSCOPY WITH PROPOFOL;  Surgeon: Petra Kuba, MD;  Location: [facility];  Service: Endoscopy;  Laterality: N/A;   EYE SURGERY     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, trama   HOT HEMOSTASIS N/A 10/17/2012   Procedure: HOT HEMOSTASIS (ARGON PLASMA COAGULATION/BICAP);  Surgeon: Petra Kuba, MD;  Location: [facility];   Service: Endoscopy;  Laterality: N/A;   TONSILLECTOMY     Family History  Problem Relation Age of Onset   Cancer Other        colon ca 1st degree relative <60   Social History   Socioeconomic History   Marital status: Married    Spouse name: Not on file   Number of children: Not on file   Years of education: Not on file   Highest education level: Not on file  Occupational History   Occupation: Horticulturist, commercial  Tobacco Use   Smoking status: Every Day    Packs/day: 1.00    Years: 25.00    Additional pack years: 0.00    Total pack years: 25.00    Types: Cigarettes   Smokeless tobacco: Never  Substance and Sexual Activity   Alcohol use: No    Comment: ber q week   Drug use: No   Sexual activity: Not Currently  Other Topics Concern   Not on file  Social History Narrative   Regular exercise-yes   Social Determinants of Health   Financial Resource Strain: Not on file  Food Insecurity: No Food Insecurity (10/29/2022)   Hunger Vital Sign    Worried About Running Out of Food in the Last Year: Never true    Ran Out of Food in the Last Year: Never true  Transportation Needs: No Transportation Needs (10/29/2022)   PRAPARE - Administrator, Civil Service (Medical): No    Lack of Transportation (Non-Medical): No  Physical Activity: Not on file  Stress: Not on file  Social Connections: Not on file  Intimate Partner Violence: Not At Risk (10/29/2022)   Humiliation, Afraid, Rape, and Kick questionnaire    Fear of Current or Ex-Partner: No    Emotionally Abused: No    Physically Abused: No    Sexually Abused: No   Outpatient Medications Prior to Visit  Medication Sig Dispense Refill   amLODipine (NORVASC) 10 MG tablet Take 1 tablet (10 mg total) by mouth daily. 30 tablet 0   aspirin 81 MG chewable tablet Chew 1 tablet (81 mg total) by mouth daily. 90 tablet 0   chlorthalidone (HYGROTON) 25 MG tablet Take 1 tablet (25 mg total) by mouth daily. 30 tablet 0   irbesartan  (AVAPRO) 300 MG tablet Take 1 tablet (300 mg total) by mouth daily. 30 tablet 0   metoprolol tartrate (LOPRESSOR) 50 MG tablet Take 1 tablet (50 mg total) by mouth 2 (two) times daily. 60 tablet 0   nicotine (NICODERM CQ - DOSED IN MG/24 HOURS) 14 mg/24hr patch Place 1 patch (14 mg total) onto the skin daily at 6 (six) AM. 28 patch 0   rosuvastatin (CRESTOR) 10 MG tablet Take 1 tablet (10 mg total) by mouth daily. 30 tablet 0   spironolactone (ALDACTONE) 25 MG tablet Take 0.5 tablets (12.5 mg total) by mouth daily. 30 tablet 0   No facility-administered medications prior to visit.   Allergies  Allergen Reactions   Amoxil [Amoxicillin] Hives    itching    ROS: A complete ROS was performed with pertinent positives/negatives noted in the HPI. The remainder of the ROS are negative.   Objective:   There were no vitals filed for this visit.  GENERAL: Well-appearing, in NAD. Well nourished.  SKIN: Pink, warm and dry. No rash, lesion, ulceration, or ecchymoses.  Head: Normocephalic. NECK: Trachea midline. Full ROM w/o pain or tenderness. No lymphadenopathy.  EARS: Tympanic membranes are intact, translucent without bulging and without drainage. Appropriate landmarks visualized.  EYES: Conjunctiva clear without exudates. EOMI, PERRL, no drainage present.  THROAT: Uvula midline. Oropharynx clear. Tonsils non-inflamed without exudate. Mucous membranes pink and moist.  RESPIRATORY: Chest wall symmetrical. Respirations even and non-labored. Breath sounds clear to auscultation bilaterally.  CARDIAC: S1, S2 present, regular rate and rhythm without murmur or gallops. Peripheral pulses 2+ bilaterally.  MSK: Muscle tone and strength appropriate for age. Joints w/o tenderness, redness, or swelling.  EXTREMITIES: Without clubbing, cyanosis, or edema.  NEUROLOGIC: No motor or sensory deficits. Steady, even gait. C2-C12 intact.  PSYCH/MENTAL STATUS: Alert, oriented x 3. Cooperative, appropriate mood and  affect.    Health Maintenance Due  Topic Date Due   COVID-19 Vaccine (1) Never done   Zoster Vaccines- Shingrix (1 of 2) Never done   DTaP/Tdap/Td (2 - Tdap) 08/29/2018    No results found for any visits on 11/18/22.     Assessment & Plan:  There are no diagnoses linked to this encounter.    Patient to reach out to office if new, worrisome, or unresolved symptoms arise or if no improvement in patient's condition. Patient verbalized understanding and is agreeable to treatment plan. All questions answered to patient's satisfaction.    No follow-ups on file.   Of note, portions of this note may have been created with voice recognition software Physicist, medical). While this note has been edited for accuracy, occasional wrong-word or 'sound-a-like' substitutions may have occurred due to the inherent limitations of voice recognition software.  Yolanda Manges, FNP

## 2022-11-18 ENCOUNTER — Ambulatory Visit (HOSPITAL_BASED_OUTPATIENT_CLINIC_OR_DEPARTMENT_OTHER): Payer: No Typology Code available for payment source | Admitting: Family Medicine

## 2022-11-18 ENCOUNTER — Encounter (HOSPITAL_BASED_OUTPATIENT_CLINIC_OR_DEPARTMENT_OTHER): Payer: Self-pay | Admitting: Family Medicine

## 2022-11-18 ENCOUNTER — Other Ambulatory Visit (HOSPITAL_BASED_OUTPATIENT_CLINIC_OR_DEPARTMENT_OTHER): Payer: Self-pay

## 2022-11-18 VITALS — BP 123/56 | HR 42 | Ht 69.0 in | Wt 170.6 lb

## 2022-11-18 DIAGNOSIS — Z1211 Encounter for screening for malignant neoplasm of colon: Secondary | ICD-10-CM

## 2022-11-18 DIAGNOSIS — Z09 Encounter for follow-up examination after completed treatment for conditions other than malignant neoplasm: Secondary | ICD-10-CM | POA: Insufficient documentation

## 2022-11-18 DIAGNOSIS — I10 Essential (primary) hypertension: Secondary | ICD-10-CM | POA: Diagnosis not present

## 2022-11-18 DIAGNOSIS — I71019 Dissection of thoracic aorta, unspecified: Secondary | ICD-10-CM | POA: Diagnosis not present

## 2022-11-18 DIAGNOSIS — Z716 Tobacco abuse counseling: Secondary | ICD-10-CM | POA: Insufficient documentation

## 2022-11-18 DIAGNOSIS — Z7689 Persons encountering health services in other specified circumstances: Secondary | ICD-10-CM | POA: Diagnosis not present

## 2022-11-18 MED ORDER — IRBESARTAN 300 MG PO TABS
300.0000 mg | ORAL_TABLET | Freq: Every day | ORAL | 3 refills | Status: DC
Start: 2022-11-18 — End: 2023-11-05
  Filled 2022-11-18: qty 90, 90d supply, fill #0
  Filled 2023-01-29: qty 90, 90d supply, fill #1
  Filled 2023-05-22: qty 90, 90d supply, fill #2
  Filled 2023-08-20 (×2): qty 90, 90d supply, fill #3

## 2022-11-18 MED ORDER — METOPROLOL TARTRATE 50 MG PO TABS
50.0000 mg | ORAL_TABLET | Freq: Two times a day (BID) | ORAL | 3 refills | Status: DC
Start: 2022-11-18 — End: 2023-06-09
  Filled 2022-11-18: qty 180, 90d supply, fill #0
  Filled 2023-01-29: qty 180, 90d supply, fill #1
  Filled 2023-05-22: qty 180, 90d supply, fill #2

## 2022-11-18 MED ORDER — SPIRONOLACTONE 25 MG PO TABS
12.5000 mg | ORAL_TABLET | Freq: Every day | ORAL | 0 refills | Status: DC
Start: 2022-11-18 — End: 2022-12-07
  Filled 2022-11-18: qty 15, 30d supply, fill #0

## 2022-11-18 MED ORDER — CHLORTHALIDONE 25 MG PO TABS
25.0000 mg | ORAL_TABLET | Freq: Every day | ORAL | 3 refills | Status: DC
Start: 2022-11-18 — End: 2023-11-05
  Filled 2022-11-18: qty 90, 90d supply, fill #0
  Filled 2023-01-29: qty 90, 90d supply, fill #1
  Filled 2023-05-22: qty 90, 90d supply, fill #2
  Filled 2023-08-20 (×2): qty 90, 90d supply, fill #3

## 2022-11-18 MED ORDER — ASPIRIN 81 MG PO CHEW
81.0000 mg | CHEWABLE_TABLET | Freq: Every day | ORAL | 3 refills | Status: AC
Start: 2022-11-18 — End: ?
  Filled 2022-11-18 – 2023-01-29 (×2): qty 90, 90d supply, fill #0

## 2022-11-18 MED ORDER — ROSUVASTATIN CALCIUM 10 MG PO TABS
10.0000 mg | ORAL_TABLET | Freq: Every day | ORAL | 3 refills | Status: DC
Start: 2022-11-18 — End: 2023-11-05
  Filled 2022-11-18: qty 90, 90d supply, fill #0
  Filled 2023-01-29: qty 90, 90d supply, fill #1
  Filled 2023-05-22: qty 90, 90d supply, fill #2
  Filled 2023-08-20 (×2): qty 90, 90d supply, fill #3

## 2022-11-18 MED ORDER — AMLODIPINE BESYLATE 10 MG PO TABS
10.0000 mg | ORAL_TABLET | Freq: Every day | ORAL | 3 refills | Status: DC
Start: 2022-11-18 — End: 2023-11-05
  Filled 2022-11-18: qty 90, 90d supply, fill #0
  Filled 2023-01-29: qty 90, 90d supply, fill #1
  Filled 2023-05-22: qty 90, 90d supply, fill #2
  Filled 2023-08-20 (×2): qty 90, 90d supply, fill #3

## 2022-11-18 MED ORDER — NICOTINE 14 MG/24HR TD PT24
14.0000 mg | MEDICATED_PATCH | Freq: Every day | TRANSDERMAL | 0 refills | Status: DC
Start: 2022-11-18 — End: 2022-12-07
  Filled 2022-11-18: qty 28, 28d supply, fill #0

## 2022-11-18 NOTE — Patient Instructions (Addendum)
Continue to monitor your blood pressure. If lower than 100/50, call PCP.   Follow up with Vascular and schedule your CT (at [facility] if preferred).    Nicotine patches:  14 mg Patch for the next 2-3 weeks, then we will decrease to 7mg  patches.    Schedule your Colonoscopy with [facility]. Order for referral has been placed.

## 2022-11-18 NOTE — Assessment & Plan Note (Signed)
Repeat CMP, CBC due to AKI at time of discharge.  Will complete FMLA paperwork for patient and faxed to employer.

## 2022-11-18 NOTE — Assessment & Plan Note (Signed)
Patient to schedule CTA follow-up in the next 1 to 2 weeks and has follow-up with vascular surgery on August 8.  Medications from hospital discharge have been refilled.  Patient encouraged to monitor blood pressure and the call PCP if less than 100-110/50.  He is also encouraged to call PCP if he is symptomatic with dizziness, chest pain, swelling, palpitations.

## 2022-11-18 NOTE — Assessment & Plan Note (Signed)
Patient to return for MWV in 2 weeks.  Lab work obtained today for upcoming visit.

## 2022-11-18 NOTE — Assessment & Plan Note (Signed)
Patient congratulated on cessation from tobacco so far.  Continue with nicotine therapy and decrease to 14 mg patches for the next 2 to 3 weeks.  Will attempt to decrease to 7 mg patches at next follow-up.  Discussed safe use of this medication with patient

## 2022-11-18 NOTE — Assessment & Plan Note (Signed)
Medications refilled.  Controlled at this time.  Patient will continue to log blood pressure and parameters reviewed.

## 2022-11-18 NOTE — Assessment & Plan Note (Signed)
Patient due for 5-year repeat C-scope.  Referral placed to [facility].

## 2022-11-19 LAB — LIPID PANEL
Chol/HDL Ratio: 3 ratio (ref 0.0–5.0)
Cholesterol, Total: 87 mg/dL — ABNORMAL LOW (ref 100–199)
HDL: 29 mg/dL — ABNORMAL LOW (ref >39)
LDL Chol Calc (NIH): 41 mg/dL (ref 0–99)
Triglycerides: 80 mg/dL (ref 0–149)
VLDL Cholesterol Cal: 17 mg/dL (ref 5–40)

## 2022-11-19 LAB — COMPREHENSIVE METABOLIC PANEL
ALT: 23 IU/L (ref 0–44)
AST: 20 IU/L (ref 0–40)
Albumin: 4.2 g/dL (ref 3.9–4.9)
Alkaline Phosphatase: 124 IU/L — ABNORMAL HIGH (ref 44–121)
BUN/Creatinine Ratio: 16 (ref 10–24)
BUN: 20 mg/dL (ref 8–27)
Bilirubin Total: 0.7 mg/dL (ref 0.0–1.2)
CO2: 26 mmol/L (ref 20–29)
Calcium: 9.7 mg/dL (ref 8.6–10.2)
Chloride: 92 mmol/L — ABNORMAL LOW (ref 96–106)
Creatinine, Ser: 1.23 mg/dL (ref 0.76–1.27)
Globulin, Total: 2.3 g/dL (ref 1.5–4.5)
Glucose: 129 mg/dL — ABNORMAL HIGH (ref 70–99)
Potassium: 5 mmol/L (ref 3.5–5.2)
Sodium: 131 mmol/L — ABNORMAL LOW (ref 134–144)
Total Protein: 6.5 g/dL (ref 6.0–8.5)
eGFR: 63 mL/min/{1.73_m2} (ref >59)

## 2022-11-19 LAB — CBC WITH DIFFERENTIAL/PLATELET
Basophils Absolute: 0.1 10*3/uL (ref 0.0–0.2)
Basos: 1 %
EOS (ABSOLUTE): 0.2 10*3/uL (ref 0.0–0.4)
Eos: 2 %
Hematocrit: 39.9 % (ref 37.5–51.0)
Hemoglobin: 13.6 g/dL (ref 13.0–17.7)
Immature Grans (Abs): 0.1 10*3/uL (ref 0.0–0.1)
Immature Granulocytes: 1 %
Lymphocytes Absolute: 1.4 10*3/uL (ref 0.7–3.1)
Lymphs: 15 %
MCH: 31.2 pg (ref 26.6–33.0)
MCHC: 34.1 g/dL (ref 31.5–35.7)
MCV: 92 fL (ref 79–97)
Monocytes Absolute: 0.7 10*3/uL (ref 0.1–0.9)
Monocytes: 7 %
Neutrophils Absolute: 7.1 10*3/uL — ABNORMAL HIGH (ref 1.4–7.0)
Neutrophils: 74 %
Platelets: 201 10*3/uL (ref 150–450)
RBC: 4.36 x10E6/uL (ref 4.14–5.80)
RDW: 13.2 % (ref 11.6–15.4)
WBC: 9.4 10*3/uL (ref 3.4–10.8)

## 2022-11-19 LAB — PSA: Prostate Specific Ag, Serum: 0.5 ng/mL (ref 0.0–4.0)

## 2022-11-19 LAB — HEMOGLOBIN A1C
Est. average glucose Bld gHb Est-mCnc: 140 mg/dL
Hgb A1c MFr Bld: 6.5 % — ABNORMAL HIGH (ref 4.8–5.6)

## 2022-11-19 LAB — TSH: TSH: 0.73 u[IU]/mL (ref 0.450–4.500)

## 2022-11-22 ENCOUNTER — Encounter (HOSPITAL_BASED_OUTPATIENT_CLINIC_OR_DEPARTMENT_OTHER): Payer: Self-pay

## 2022-11-22 NOTE — Progress Notes (Signed)
Cholesterol is excellent, even on the low end of normal actually. You could decrease your rosuvastatin to 5mg  nightly if you would like. Your sodium is a bit low. I would recommend increasing your fluid intake with drinks such as Gatorade, Propel, Powerade that has electrolytes in it to replace your salt level. This is likely due to the spironolactone diuretic that is assisting your blood pressure. I would recommend speaking with vascular first before we decrease the dosage. We could repeat your sodium level in 2 weeks to make sure this has increased to normal. Additionally, your A1C (measure for diabetes) shows we are just over the diabetic marker of 6.4 or less. I would recommend dietary changes such as decreasing carbohydrates, watching sugar intake and we can recheck in 6 months.

## 2022-12-06 ENCOUNTER — Telehealth (HOSPITAL_BASED_OUTPATIENT_CLINIC_OR_DEPARTMENT_OTHER): Payer: Self-pay | Admitting: Family Medicine

## 2022-12-06 NOTE — Telephone Encounter (Signed)
Staff message received:  Hilbert Bible, FNP  Briasia Flinders, Farley Ly, CMA Can you call patient and see if he is agreeable to repeat BMP to check his sodium? If so, we can order lab.       Previous Messages    ----- Message ----- From: Hilbert Bible, FNP Sent: 12/06/2022  12:00 AM EDT To: Hilbert Bible, FNP  Repeat Na with BMP     Called and spoke with pt about the message from [place]. Pt was agreeable coming in to have bloodwork done but also stated he believed he was due for a f/u. Checked pt's chart and he was to have a f/u 2 weeks from last OV which would be now so appt has been scheduled for him as well as spouse (established pt of Alexis as well) as they both were to have appt 2-weeks from prior appt.  Routing to [employer] as an Financial planner.

## 2022-12-07 ENCOUNTER — Encounter (HOSPITAL_BASED_OUTPATIENT_CLINIC_OR_DEPARTMENT_OTHER): Payer: Self-pay | Admitting: Family Medicine

## 2022-12-07 ENCOUNTER — Ambulatory Visit (HOSPITAL_BASED_OUTPATIENT_CLINIC_OR_DEPARTMENT_OTHER): Payer: No Typology Code available for payment source | Admitting: Family Medicine

## 2022-12-07 VITALS — BP 126/48 | HR 44 | Ht 69.0 in | Wt 154.4 lb

## 2022-12-07 DIAGNOSIS — E119 Type 2 diabetes mellitus without complications: Secondary | ICD-10-CM

## 2022-12-07 DIAGNOSIS — I71019 Dissection of thoracic aorta, unspecified: Secondary | ICD-10-CM | POA: Diagnosis not present

## 2022-12-07 DIAGNOSIS — I10 Essential (primary) hypertension: Secondary | ICD-10-CM | POA: Diagnosis not present

## 2022-12-07 DIAGNOSIS — Z0000 Encounter for general adult medical examination without abnormal findings: Secondary | ICD-10-CM

## 2022-12-07 DIAGNOSIS — Z716 Tobacco abuse counseling: Secondary | ICD-10-CM

## 2022-12-07 MED ORDER — SPIRONOLACTONE 25 MG PO TABS: 12.5000 mg | ORAL_TABLET | Freq: Every day | ORAL | 0 refills | Status: DC

## 2022-12-07 MED ORDER — NICOTINE 7 MG/24HR TD PT24: 7.0000 mg | MEDICATED_PATCH | Freq: Every day | TRANSDERMAL | 0 refills | Status: DC

## 2022-12-07 NOTE — Progress Notes (Signed)
Subjective:   Jaime Castillo [birth year] 12/07/2022  No chief complaint on file.   HPI: Jaime Castillo presents today for re-assessment and management of chronic medical conditions.   HYPERTENSION: Jaime Castillo presents for the medical management of hypertension. He states he is taking the Metoprolol BID and has trouble remembering the 2nd dosage. He states he is going to the bathroom every two hours due to his diuretic.   Patient's current hypertension medication regimen is: Amlodipine 10mg , Chlorthalidone 25mg  every day, Irbesartan 300mg  every day, Metoprolol 50mg  BID, Spironolactone 12.5mg  every day.   Patient is  currently taking prescribed medications for HTN.  Patient is  regularly keeping a check on BP at home.  Adhering to low sodium diet: Yes Exercising Regularly: No Denies headache, dizziness, CP, SHOB, vision changes.   BP Readings from Last 3 Encounters:  12/07/22 (!) 126/48  11/18/22 (!) 123/56  10/30/22 (!) 122/58   DIABETES MELLITUS: Jaime Castillo presents for the medical management of diabetes. New diagnosis for patient.  Current diabetes medication regimen: Diet Patient is not adhering to a diabetic diet.  Patient is not exercising regularly.  Patient is not checking BS regularly.  Patient is  checking their feet regularly.  Denies polydipsia, polyphagia, polyuria, open wounds or ulcers on feet.  Lab Results  Component Value Date   HGBA1C 6.5 (H) 11/18/2022    12/07/2022 No results found for: "LABMICR", "MICROALBUR"  Wt Readings from Last 3 Encounters:  12/07/22 154 lb 6.4 oz (70 kg)  11/18/22 170 lb 9.6 oz (77.4 kg)  10/30/22 172 lb 9.9 oz (78.3 kg)     SMOKING CESSATION:  Patient reports he is still using nicotine 14 mg patches daily.  He states that he is not smoking any cigarettes and that the nicotine patches have been controlling his cravings significantly.  He did start to nurse noticed irritation to the skin from the nicotine  patches and has been rotating sites.  States that he has noticed redness and "welts" after removing patches with itching.  He applied Benadryl and hydrocortisone cream with relief.  Denied any shortness of breath, tongue swelling or facial edema with use of nicotine patches.     The following portions of the patient's history were reviewed and updated as appropriate: past medical history, past surgical history, family history, social history, allergies, medications, and problem list.   Patient Active Problem List   Diagnosis Date Noted   Type 2 diabetes mellitus without complication, without long-term current use of insulin (HCC) 12/07/2022   Encounter for tobacco use cessation counseling 11/18/2022   Screening for colon cancer 11/18/2022   Hospital discharge follow-up 11/18/2022   Encounter to establish care with new doctor 11/18/2022   Aortic dissection distal to left subclavian (HCC) 10/26/2022   Hyperlipidemia with target LDL less than 100 08/10/2016   Allergic rhinitis 08/09/2013   Hyperglycemia 08/09/2013   Routine general medical examination at a health care facility 08/08/2012   Abnormal electrocardiogram (ECG) (EKG) 09/11/2008   TOBACCO USE 08/28/2008   Essential hypertension, benign 08/28/2008   BPH (benign prostatic hyperplasia) 08/28/2008   History of colonic polyps 08/28/2008   Past Medical History:  Diagnosis Date   Colonic polyp    Hypertension    Past Surgical History:  Procedure Laterality Date   COLONOSCOPY WITH PROPOFOL N/A 10/17/2012   Procedure: COLONOSCOPY WITH PROPOFOL;  Surgeon: Petra Kuba, MD;  Location: [facility];  Service: Endoscopy;  Laterality: N/A;   EYE  SURGERY     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 90 tablet 3   aspirin 81 MG chewable tablet Chew 1 tablet (81 mg total) by mouth daily. 90 tablet 3   chlorthalidone (HYGROTON) 25 MG tablet Take 1 tablet (25 mg total) by mouth daily. 90 tablet 3   irbesartan (AVAPRO) 300 MG tablet Take 1 tablet (300 mg total) by mouth daily. 90 tablet 3   metoprolol tartrate (LOPRESSOR) 50 MG tablet Take 1 tablet (50 mg total) by mouth 2 (two) times daily. 180 tablet 3   rosuvastatin (CRESTOR) 10 MG tablet Take 1 tablet (10 mg total) by mouth daily. 90 tablet 3   nicotine (NICODERM CQ - DOSED IN MG/24 HOURS) 14 mg/24hr patch Place 1 patch (14 mg total) onto the skin daily at 6 (six) AM. 28 patch 0   spironolactone (ALDACTONE) 25 MG tablet Take 0.5 tablets (12.5 mg total) by mouth daily. 90 tablet 0   No facility-administered medications prior to visit.   Allergies  Allergen Reactions   Amoxil [Amoxicillin] Hives    itching     ROS: A complete ROS was performed with pertinent positives/negatives noted in the HPI. The remainder of the ROS are negative.    Objective:   Today's Vitals   12/07/22 0807  BP: (!) 126/48  Pulse: (!) 44  SpO2: 100%  Weight: 154 lb 6.4 oz (70 kg)  Height: 5\' 9"  (1.753 m)    Physical Exam          GENERAL: Well-appearing, in NAD. Well nourished.  SKIN: Pink, warm and dry. No rash, lesion, ulceration, or ecchymoses.  Head: Normocephalic. NECK: Trachea midline. Full ROM w/o pain or tenderness. No lymphadenopathy.  EARS: Tympanic membranes are intact, translucent without bulging and without drainage. Appropriate landmarks visualized.  EYES: Conjunctiva clear without exudates. EOMI, PERRL, no drainage present.  NOSE: Septum midline w/o deformity. Nares patent, mucosa pink and non-inflamed  w/o drainage. No sinus tenderness.  THROAT: Uvula midline. Oropharynx clear. Tonsils non-inflamed without exudate. Mucous membranes pink and moist.  RESPIRATORY: Chest wall symmetrical. Respirations even and non-labored. Breath sounds clear to auscultation bilaterally.  CARDIAC: S1, S2 present, regular rate and rhythm without murmur or gallops. Peripheral pulses 2+ bilaterally.  MSK: Muscle tone and strength appropriate for age. Joints w/o tenderness, redness, or swelling.  EXTREMITIES: Without clubbing, cyanosis, or edema.  NEUROLOGIC: No motor or sensory deficits. Steady, even gait. C2-C12 intact.  PSYCH/MENTAL STATUS: Alert, oriented x 3. Cooperative, appropriate mood and affect.   Diabetic Foot Form - Detailed   Diabetic Foot Exam - detailed Diabetic Foot exam was performed with the following findings: Yes 12/07/2022 11:04 AM  Visual Foot Exam completed.: Yes  Can the patient see the bottom of their feet?: Yes Are the shoes appropriate in style and fit?: Yes Is there swelling or and abnormal foot shape?: No Is there a claw toe deformity?: No Is there elevated skin temparature?:  No Is there foot or ankle muscle weakness?: No Normal Range of Motion: Yes Pulse Foot Exam completed.: Yes   Right posterior Tibialias: Present Left posterior Tibialias: Present   Right Dorsalis Pedis: Present Left Dorsalis Pedis: Present  Sensory Foot Exam Completed.: Yes Semmes-Weinstein Monofilament Test R Site 1-Great Toe: Pos L Site 1-Great Toe: Pos           Health Maintenance Due  Topic Date Due   OPHTHALMOLOGY EXAM  Never done   Diabetic kidney evaluation - Urine ACR  Never done   Zoster Vaccines- Shingrix (1 of 2) Never done   DTaP/Tdap/Td (2 - Tdap) 08/29/2018   COVID-19 Vaccine (1 - 2023-24 season) Never done    No results found for any visits on 12/07/22.  The ASCVD Risk score (Arnett DK, et al., 2019) failed to calculate for the following reasons:   The valid total cholesterol  range is 130 to 320 mg/dL     Assessment & Plan:  1. Encounter for Medicare annual wellness exam Completed and reviewed preventative screenings and immunizations with patient. Recommend Tdap, Shingrix vaccines for patient.   Patient declines routine vaccines.We discussed the risk of this means means there could be underlying issue or malignancy not identified, increased morbidity, and even mortality. Patient verbalized understanding and acceptance of risk. Patient knows they can change their mind at any time and we will be happy to coordinate these things for them.    2. Aortic dissection distal to left subclavian (HCC) 3. Essential hypertension, benign Due to increased hyponatremia, hypotension and diuresis, I will recommend discontinuing Spironolactone. Will repeat BMP today. He has appt the first week of August to see Vascular regarding Aortic dissection and HTN follow up. He may benefit from reduction of metoprolol at this time but will let Vascular evaluate first. Pt agreeable to monitor BP, HR and hold metoprolol if BP low or HR lower than 55 bpm.  4. Type 2 diabetes mellitus without complication, without long-term current use of insulin (HCC) New diagnosis for patient. Discussed importance of low carb and counting carbs in diet. Will trial diet for the next 6 months and recheck A1C as he would prefer not to take medication unless needed. Urine albumin and foot exam completed in office today. Recommend diabetic eye exam in the next 6 months.   - Microalbumin / creatinine urine ratio - Basic Metabolic Panel (BMET)  5. Encounter for tobacco use cessation counseling Doing well without cigarette use at this time. Will reduce dosage to 7mg  patches daily for the next 4 weeks and hopefully be able to discontinue entirely.     Meds ordered this encounter  Medications   DISCONTD: spironolactone (ALDACTONE) 25 MG tablet    Sig: Take 0.5 tablets (12.5 mg total) by mouth daily.    Dispense:   90 tablet    Refill:  0   nicotine (NICODERM CQ - DOSED IN MG/24 HR) 7 mg/24hr patch    Sig: Place 1 patch (7 mg total) onto the skin daily.    Dispense:  28 patch    Refill:  0    Order Specific Question:   Supervising Provider    Answer:   DE [place], RAYMOND J [[id number]]   Lab Orders         Microalbumin / creatinine urine ratio         Basic Metabolic Panel (BMET)     Return in about 6 months (around 06/09/2023) for DIABETES CHECK UP.    Patient to  reach out to office if new, worrisome, or unresolved symptoms arise or if no improvement in patient's condition. Patient verbalized understanding and is agreeable to treatment plan. All questions answered to patient's satisfaction.   Of note, portions of this note may have been created with voice recognition software Physicist, medical). While this note has been edited for accuracy, occasional wrong-word or 'sound-a-like' substitutions may have occurred due to the inherent limitations of voice recognition software.  Yolanda Manges, FNP  Subjective:   Jaime Castillo is a 72 y.o. male who presents for Medicare Annual/Subsequent preventive examination.  Visit Complete: In person  Patient Medicare AWV questionnaire was completed by the patient on 12/07/22; I have confirmed that all information answered by patient is correct and no changes since this date.  Review of Systems    A complete ROS was performed with pertinent positives/negatives noted in the HPI. The remainder of the ROS are negative.    Cardiac Risk Factors include: advanced age (>40men, >33 women);diabetes mellitus;hypertension     Objective:    Today's Vitals   12/07/22 0807  BP: (!) 126/48  Pulse: (!) 44  SpO2: 100%  Weight: 154 lb 6.4 oz (70 kg)  Height: 5\' 9"  (1.753 m)   Body mass index is 22.8 kg/m.     12/07/2022    8:40 AM 10/26/2022    2:21 PM 10/03/2012    1:34 PM  Advanced Directives  Does Patient Have a Medical Advance Directive? No No Patient does  not have advance directive  Would patient like information on creating a medical advance directive? No - Patient declined No - Patient declined     Current Medications (verified) Outpatient Encounter Medications as of 12/07/2022  Medication Sig   amLODipine (NORVASC) 10 MG tablet Take 1 tablet (10 mg total) by mouth daily.   aspirin 81 MG chewable tablet Chew 1 tablet (81 mg total) by mouth daily.   chlorthalidone (HYGROTON) 25 MG tablet Take 1 tablet (25 mg total) by mouth daily.   irbesartan (AVAPRO) 300 MG tablet Take 1 tablet (300 mg total) by mouth daily.   metoprolol tartrate (LOPRESSOR) 50 MG tablet Take 1 tablet (50 mg total) by mouth 2 (two) times daily.   nicotine (NICODERM CQ - DOSED IN MG/24 HR) 7 mg/24hr patch Place 1 patch (7 mg total) onto the skin daily.   rosuvastatin (CRESTOR) 10 MG tablet Take 1 tablet (10 mg total) by mouth daily.   [DISCONTINUED] nicotine (NICODERM CQ - DOSED IN MG/24 HOURS) 14 mg/24hr patch Place 1 patch (14 mg total) onto the skin daily at 6 (six) AM.   [DISCONTINUED] spironolactone (ALDACTONE) 25 MG tablet Take 0.5 tablets (12.5 mg total) by mouth daily.   [DISCONTINUED] spironolactone (ALDACTONE) 25 MG tablet Take 0.5 tablets (12.5 mg total) by mouth daily.   No facility-administered encounter medications on file as of 12/07/2022.    Allergies (verified) Amoxil [amoxicillin]   History: Past Medical History:  Diagnosis Date   Colonic polyp    Hypertension    Past Surgical History:  Procedure Laterality Date   COLONOSCOPY WITH PROPOFOL N/A 10/17/2012   Procedure: COLONOSCOPY WITH PROPOFOL;  Surgeon: Petra Kuba, MD;  Location: [facility];  Service: Endoscopy;  Laterality: N/A;   EYE SURGERY     lost Right eye 12yrs of age, trama   HOT HEMOSTASIS N/A 10/17/2012   Procedure: HOT HEMOSTASIS (ARGON PLASMA COAGULATION/BICAP);  Surgeon: Petra Kuba, MD;  Location: [facility];  Service: Endoscopy;  Laterality:  N/A;   TONSILLECTOMY     Family  History  Problem Relation Age of Onset   Cancer Mother    Cancer Other        colon ca 1st degree relative <60   Social History   Socioeconomic History   Marital status: Married    Spouse name: Not on file   Number of children: Not on file   Years of education: Not on file   Highest education level: Not on file  Occupational History   Occupation: Horticulturist, commercial  Tobacco Use   Smoking status: Former    Current packs/day: 0.75    Average packs/day: 0.8 packs/day for 77.6 years (64.4 ttl pk-yrs)    Types: Cigarettes    Start date: 37   Smokeless tobacco: Never  Substance and Sexual Activity   Alcohol use: No    Comment: ber q week   Drug use: No   Sexual activity: Not Currently  Other Topics Concern   Not on file  Social History Narrative   Regular exercise-yes   Social Determinants of Health   Financial Resource Strain: Not on file  Food Insecurity: No Food Insecurity (10/29/2022)   Hunger Vital Sign    Worried About Running Out of Food in the Last Year: Never true    Ran Out of Food in the Last Year: Never true  Transportation Needs: No Transportation Needs (10/29/2022)   PRAPARE - Administrator, Civil Service (Medical): No    Lack of Transportation (Non-Medical): No  Physical Activity: Not on file  Stress: Not on file  Social Connections: Not on file    Tobacco Counseling Counseling given: Yes   Clinical Intake:     Pain : No/denies pain     Nutritional Status: BMI of 19-24  Normal Diabetes: Yes CBG done?: No Did pt. bring in CBG monitor from home?: No  How often do you need to have someone help you when you read instructions, pamphlets, or other written materials from your doctor or pharmacy?: 1 - Never What is the last grade level you completed in school?: Some College  Interpreter Needed?: No      Activities of Daily Living    12/07/2022    8:39 AM 10/29/2022    9:00 PM  In your present state of health, do you have any  difficulty performing the following activities:  Hearing? 0 0  Vision? 0 0  Difficulty concentrating or making decisions? 0 0  Walking or climbing stairs? 0 0  Dressing or bathing? 0 0  Doing errands, shopping? 0 0  Preparing Food and eating ? N   Using the Toilet? N   In the past six months, have you accidently leaked urine? N   Do you have problems with loss of bowel control? N   Managing your Medications? N   Housekeeping or managing your Housekeeping? N     Patient Care Team: Burhan Barham, Shelton Silvas, FNP as PCP - General (Family Medicine)  Indicate any recent Medical Services you may have received from other than [facility] providers in the past year (date may be approximate).     Assessment:   This is a routine wellness examination for [place].  Hearing/Vision screen No results found.  Dietary issues and exercise activities discussed:     Goals Addressed   None   Depression Screen    12/07/2022    8:42 AM 11/18/2022   10:27 AM 10/19/2017    4:08 PM  PHQ 2/9  Scores  PHQ - 2 Score 0 0 0  PHQ- 9 Score  0     Fall Risk    12/07/2022    8:41 AM 11/18/2022   10:27 AM 10/19/2017    4:08 PM  Fall Risk   Falls in the past year? 0 0 No  Number falls in past yr: 0 0   Injury with Fall? 0 0     MEDICARE RISK AT HOME:  Medicare Risk at Home - 12/07/22 0841     Any stairs in or around the home? Yes    If so, are there any without handrails? No    Home free of loose throw rugs in walkways, pet beds, electrical cords, etc? Yes    Adequate lighting in your home to reduce risk of falls? No    Life alert? No    Use of a cane, walker or w/c? No    Grab bars in the bathroom? Yes    Shower chair or bench in shower? No    Elevated toilet seat or a handicapped toilet? No             TIMED UP AND GO:  Was the test performed?  Yes  Length of time to ambulate 10 feet: 5 sec Gait steady and fast without use of assistive device    Cognitive Function:        12/07/2022     8:42 AM  6CIT Screen  What Year? 4 points  What month? 3 points  What time? 3 points  Count back from 20 0 points  Months in reverse 0 points  Repeat phrase 0 points  Total Score 10 points    Immunizations Immunization History  Administered Date(s) Administered   Influenza Whole 02/25/2012   Influenza, High Dose Seasonal PF 02/22/2018, 02/23/2019   Influenza,inj,Quad PF,6+ Mos 02/24/2017   Influenza-Unspecified 02/18/2016, 02/26/2019   Pneumococcal Conjugate-13 08/04/2015   Pneumococcal Polysaccharide-23 10/19/2017   Td 08/28/2008    TDAP status: Due, Education has been provided regarding the importance of this vaccine. Advised may receive this vaccine at local pharmacy or Health Dept. Aware to provide a copy of the vaccination record if obtained from local pharmacy or Health Dept. Verbalized acceptance and understanding.  Flu Vaccine status: Due, Education has been provided regarding the importance of this vaccine. Advised may receive this vaccine at local pharmacy or Health Dept. Aware to provide a copy of the vaccination record if obtained from local pharmacy or Health Dept. Verbalized acceptance and understanding.  Pneumococcal vaccine status: Up to date  Covid-19 vaccine status: Declined, Education has been provided regarding the importance of this vaccine but patient still declined. Advised may receive this vaccine at local pharmacy or Health Dept.or vaccine clinic. Aware to provide a copy of the vaccination record if obtained from local pharmacy or Health Dept. Verbalized acceptance and understanding.  Qualifies for Shingles Vaccine? Yes   Zostavax completed No   Shingrix Completed?: No.    Education has been provided regarding the importance of this vaccine. Patient has been advised to call insurance company to determine out of pocket expense if they have not yet received this vaccine. Advised may also receive vaccine at local pharmacy or Health Dept. Verbalized acceptance  and understanding.  Screening Tests Health Maintenance  Topic Date Due   OPHTHALMOLOGY EXAM  Never done   Diabetic kidney evaluation - Urine ACR  Never done   Zoster Vaccines- Shingrix (1 of 2) Never done   DTaP/Tdap/Td (2 -  Tdap) 08/29/2018   COVID-19 Vaccine (1 - 2023-24 season) Never done   INFLUENZA VACCINE  12/09/2022   HEMOGLOBIN A1C  05/21/2023   Lung Cancer Screening  10/28/2023   Diabetic kidney evaluation - eGFR measurement  11/18/2023   FOOT EXAM  12/07/2023   Colonoscopy  03/29/2027   Pneumonia Vaccine 59+ Years old  Completed   Hepatitis C Screening  Completed   HPV VACCINES  Aged Out    Health Maintenance  Health Maintenance Due  Topic Date Due   OPHTHALMOLOGY EXAM  Never done   Diabetic kidney evaluation - Urine ACR  Never done   Zoster Vaccines- Shingrix (1 of 2) Never done   DTaP/Tdap/Td (2 - Tdap) 08/29/2018   COVID-19 Vaccine (1 - 2023-24 season) Never done    Colorectal cancer screening: Type of screening: Colonoscopy. Completed 2018. Repeat every 10  years  Lung Cancer Screening: (Low Dose CT Chest recommended if Age 44-80 years, 20 pack-year currently smoking OR have quit w/in 15years.) does qualify.   Lung Cancer Screening Referral: CT scheduled for 12/08/22  Additional Screening:  Hepatitis C Screening: does qualify; Completed 2014  Vision Screening: Recommended annual ophthalmology exams for early detection of glaucoma and other disorders of the eye. Is the patient up to date with their annual eye exam?  No  If pt is not established with a provider, would they like to be referred to a provider to establish care? No .   Dental Screening: Recommended annual dental exams for proper oral hygiene  Diabetic Foot Exam: Diabetic Foot Exam: Completed 12/07/22  Community Resource Referral / Chronic Care Management: CRR required this visit?  No   CCM required this visit?  No     Plan:     I have personally reviewed and noted the following in the  patient's chart:   Medical and social history Use of alcohol, tobacco or illicit drugs  Current medications and supplements including opioid prescriptions. Patient is not currently taking opioid prescriptions. Functional ability and status Nutritional status Physical activity Advanced directives List of other physicians Hospitalizations, surgeries, and ER visits in previous 12 months Vitals Screenings to include cognitive, depression, and falls Referrals and appointments  In addition, I have reviewed and discussed with patient certain preventive protocols, quality metrics, and best practice recommendations. A written personalized care plan for preventive services as well as general preventive health recommendations were provided to patient.     Yolanda Manges, [place]   12/07/2022   After Visit Summary: Printed for patient in office.

## 2022-12-07 NOTE — Patient Instructions (Addendum)
Stop your Spironolactone.   Please schedule Eye Exam given Diabetes diagnosis to check your retina.    If you feel dizzy, fatigued, and HR is lower than 50, hold dosage of Metoprolol. Discuss decrease of this with Vascular.

## 2022-12-08 ENCOUNTER — Ambulatory Visit
Admission: RE | Admit: 2022-12-08 | Discharge: 2022-12-08 | Disposition: A | Discharge status: Home / Self Care | Payer: No Typology Code available for payment source | Source: Ambulatory Visit | Attending: Vascular Surgery | Admitting: Vascular Surgery

## 2022-12-08 DIAGNOSIS — I71019 Dissection of thoracic aorta, unspecified: Secondary | ICD-10-CM

## 2022-12-08 MED ORDER — IOPAMIDOL (ISOVUE-370) INJECTION 76%
75.0000 mL | Freq: Once | INTRAVENOUS | Status: AC | PRN
  Administered 2022-12-08: 75 mL via INTRAVENOUS

## 2022-12-08 NOTE — Progress Notes (Signed)
Jaime Castillo,  Your Sodium is back to normal. Your urine albumin is normal. We will recheck this albumin level yearly.

## 2022-12-16 ENCOUNTER — Encounter: Payer: Self-pay | Admitting: Vascular Surgery

## 2022-12-16 ENCOUNTER — Ambulatory Visit (INDEPENDENT_AMBULATORY_CARE_PROVIDER_SITE_OTHER): Payer: No Typology Code available for payment source | Admitting: Vascular Surgery

## 2022-12-16 VITALS — BP 124/67 | HR 42 | Temp 98.2°F | Resp 20 | Ht 69.0 in | Wt 179.0 lb

## 2022-12-16 DIAGNOSIS — I71019 Dissection of thoracic aorta, unspecified: Secondary | ICD-10-CM | POA: Diagnosis not present

## 2022-12-16 NOTE — Progress Notes (Signed)
Patient name: Jaime Castillo MRN: [id number] DOB: 09/22/1950 Sex: male  REASON FOR VISIT:   Follow-up of intramural hematoma of thoracic aorta  HPI:   Jaime Castillo is a pleasant 72 y.o. 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  The patient had presented with chest pain.  He was admitted for tight blood pressure control.  He had a follow-up CT scan 48 hours postoperatively which showed no significant change in his intramural hematoma or focal aortic dissection.  He was discharged on hospital day 4.  He comes in for a 6-week follow-up visit and CT scan.  Since I saw him last, he has had no chest pain back pain or abdominal pain.  He checks his blood pressure multiple times during the day and his blood pressures been under good control.  He is also making excellent progress in quitting tobacco.   Current Outpatient Medications  Medication Sig Dispense Refill   amLODipine (NORVASC) 10 MG tablet Take 1 tablet (10 mg total) by mouth daily. 90 tablet 3   aspirin 81 MG chewable tablet Chew 1 tablet (81 mg total) by mouth daily. 90 tablet 3   chlorthalidone (HYGROTON) 25 MG tablet Take 1 tablet (25 mg total) by mouth daily. 90 tablet 3   irbesartan (AVAPRO) 300 MG tablet Take 1 tablet (300 mg total) by mouth daily. 90 tablet 3   metoprolol tartrate (LOPRESSOR) 50 MG tablet Take 1 tablet (50 mg total) by mouth 2 (two) times daily. 180 tablet 3   nicotine (NICODERM CQ - DOSED IN MG/24 HR) 7 mg/24hr patch Place 1 patch (7 mg total) onto the skin daily. 28 patch 0   rosuvastatin (CRESTOR) 10 MG tablet Take 1 tablet (10 mg total) by mouth daily. 90 tablet 3   No current facility-administered medications for this visit.    REVIEW OF SYSTEMS:  [X]  denotes positive finding, [ ]  denotes negative finding Vascular    Leg swelling    Cardiac    Chest pain or chest pressure:    Shortness of breath upon  exertion:    Short of breath when lying flat:    Irregular heart rhythm:    Constitutional    Fever or chills:     PHYSICAL EXAM:   Vitals:   12/16/22 0840  BP: 124/67  Pulse: (!) 42  Resp: 20  Temp: 98.2 F (36.8 C)  SpO2: 98%  Weight: 179 lb (81.2 kg)  Height: 5\' 9"  (1.753 m)    GENERAL: The patient is a well-nourished male, in no acute distress. The vital signs are documented above. CARDIOVASCULAR: There is a regular rate and rhythm. PULMONARY: There is good air exchange bilaterally without wheezing or rales. ABDOMEN: His abdomen is soft and nontender.  I do not palpate an aneurysm. VASCULAR: I do not detect carotid bruits. He has palpable femoral and pedal pulses bilaterally.  DATA:   CT ANGIO CHEST ABDOMEN PELVIS: I have reviewed the CT angiogram of the chest abdomen and pelvis that was done on 12/08/2022.  This shows slight interval decompression of the intramural hematoma which extends along the proximal descending thoracic aorta throughout the proximal abdominal aorta.  There remains multifocal small penetrating atheromatous ulcerations throughout the hematoma.  The ascending thoracic aorta measures 4.2 cm in maximum diameter.  MEDICAL ISSUES:   INTRAMURAL HEMATOMA THORACIC AORTA: His follow-up CT scan shows slight interval decompression of the intramural hematoma.  It appears that the multiple ulcers are essentially remodeling.  He is doing an excellent job with tobacco cessation.  He is very faithful in checking his blood pressure routinely and his blood pressures been under good control.  I have ordered a follow-up CT angio in 6 months and we will see him back at that time.  He understands that I will be retiring and he will be seen by one of my partners.  Waverly Ferrari [facility] [place] [phone]

## 2023-01-29 ENCOUNTER — Other Ambulatory Visit (HOSPITAL_COMMUNITY): Payer: Self-pay

## 2023-01-31 ENCOUNTER — Other Ambulatory Visit: Payer: Self-pay

## 2023-02-02 ENCOUNTER — Other Ambulatory Visit (HOSPITAL_BASED_OUTPATIENT_CLINIC_OR_DEPARTMENT_OTHER): Payer: Self-pay

## 2023-02-09 ENCOUNTER — Other Ambulatory Visit (HOSPITAL_COMMUNITY): Payer: Self-pay

## 2023-02-23 ENCOUNTER — Other Ambulatory Visit (HOSPITAL_COMMUNITY): Payer: Self-pay

## 2023-04-25 ENCOUNTER — Encounter (HOSPITAL_BASED_OUTPATIENT_CLINIC_OR_DEPARTMENT_OTHER): Payer: Self-pay | Admitting: Family Medicine

## 2023-05-20 ENCOUNTER — Other Ambulatory Visit: Payer: Self-pay

## 2023-05-20 DIAGNOSIS — I71019 Dissection of thoracic aorta, unspecified: Secondary | ICD-10-CM

## 2023-06-08 ENCOUNTER — Ambulatory Visit
Admission: RE | Admit: 2023-06-08 | Discharge: 2023-06-08 | Disposition: A | Discharge status: Home / Self Care | Payer: No Typology Code available for payment source | Source: Ambulatory Visit | Attending: Surgery | Admitting: Surgery

## 2023-06-08 DIAGNOSIS — I71019 Dissection of thoracic aorta, unspecified: Secondary | ICD-10-CM

## 2023-06-08 MED ORDER — IOPAMIDOL (ISOVUE-370) INJECTION 76%
100.0000 mL | Freq: Once | INTRAVENOUS | Status: AC | PRN
  Administered 2023-06-08: 75 mL via INTRAVENOUS

## 2023-06-09 ENCOUNTER — Encounter (HOSPITAL_BASED_OUTPATIENT_CLINIC_OR_DEPARTMENT_OTHER): Payer: Self-pay | Admitting: Family Medicine

## 2023-06-09 ENCOUNTER — Ambulatory Visit (INDEPENDENT_AMBULATORY_CARE_PROVIDER_SITE_OTHER): Payer: No Typology Code available for payment source | Admitting: Family Medicine

## 2023-06-09 VITALS — BP 109/66 | HR 44 | Ht 69.0 in | Wt 191.8 lb

## 2023-06-09 DIAGNOSIS — I10 Essential (primary) hypertension: Secondary | ICD-10-CM | POA: Diagnosis not present

## 2023-06-09 DIAGNOSIS — E119 Type 2 diabetes mellitus without complications: Secondary | ICD-10-CM

## 2023-06-09 DIAGNOSIS — I71019 Dissection of thoracic aorta, unspecified: Secondary | ICD-10-CM

## 2023-06-09 MED ORDER — METOPROLOL TARTRATE 50 MG PO TABS: 25.0000 mg | ORAL_TABLET | Freq: Two times a day (BID) | ORAL | Status: DC

## 2023-06-09 NOTE — Patient Instructions (Signed)
We will change you over to Metoprolol XR 50mg  once daily after I receive your scan.   You can decrease to 1/2 tablet twice daily of Metoprolol 50mg  currently and monitor your heart rate and blood pressure. Please send me a reading of this after 1 week.

## 2023-06-09 NOTE — Progress Notes (Signed)
Subjective:   Jaime Castillo [birth year] 06/09/2023  Chief Complaint  Patient presents with   Medical Management of Chronic Issues    52-month follow up; wants to discuss BP meds with PCP to see if anything might need to be adjusted.    HPI: Jaime Castillo presents today for re-assessment and management of chronic medical conditions.  HYPERTENSION: [name] presents for the medical management of hypertension due to history of aortic dissection to left subclavian. Patient currently sees Vascular for management and had CTA 06/08/23. Awaiting results currently. He is concerned regarding low heart rate readings and lower blood pressure values.  He states his heart rate is usually within the 40s to 50s.  He has noticed significant fatigue that occurs especially after taking his metoprolol 50 mg twice daily.  He is accompanied by his wife today who also verifies significant fatigue.  Patient's current hypertension medication regimen is: Amlodipine 10 mg, chlorthalidone 25 mg daily, irbesartan 300 mg daily, metoprolol 50 mg twice daily. Patient is  currently taking prescribed medications for HTN.  Patient is  regularly keeping a check on BP at home. Readings at home:  117/68 122/66 111/65  Systolic will fall below 100 per patient at times after taking his blood pressure medication in the morning.  HR 44-51   Adhering to low sodium diet: Yes Exercising Regularly: Yes Denies headache, dizziness, CP, SHOB, vision changes.   BP Readings from Last 3 Encounters:  06/09/23 109/66  12/16/22 124/67  12/07/22 (!) 126/48   Patient's current HLD regimen is: Crestor 10 mg Denies myalgias.  The ASCVD Risk score (Arnett DK, et al., 2019) failed to calculate for the following reasons:   The valid total cholesterol range is 130 to 320 mg/dL  Lab Results  Component Value Date   CHOL 87 (L) 11/18/2022   HDL 29 (L) 11/18/2022   LDLCALC 41 11/18/2022   LDLDIRECT 105.0 08/10/2016   TRIG  80 11/18/2022   CHOLHDL 3.0 11/18/2022    DIABETES MELLITUS: Jaime Castillo presents for the medical management of diabetes.  Current diabetes medication regimen: Diet, exercise Patient is  adhering to a diabetic diet.  Patient is  exercising regularly.  Denies polydipsia, polyphagia, polyuria, open wounds or ulcers on feet.   Lab Results  Component Value Date   HGBA1C 6.5 (H) 11/18/2022    12/07/2022 Lab Results  Component Value Date   LABMICR 20.0 12/07/2022    Wt Readings from Last 3 Encounters:  06/09/23 191 lb 12.8 oz (87 kg)  12/16/22 179 lb (81.2 kg)  12/07/22 154 lb 6.4 oz (70 kg)     The following portions of the patient's history were reviewed and updated as appropriate: past medical history, past surgical history, family history, social history, allergies, medications, and problem list.   Patient Active Problem List   Diagnosis Date Noted   Type 2 diabetes mellitus without complication, without long-term current use of insulin (HCC) 12/07/2022   Encounter for tobacco use cessation counseling 11/18/2022   Screening for colon cancer 11/18/2022   Hospital discharge follow-up 11/18/2022   Encounter to establish care with new doctor 11/18/2022   Aortic dissection distal to left subclavian (HCC) 10/26/2022   Hyperlipidemia with target LDL less than 100 08/10/2016   Allergic rhinitis 08/09/2013   Hyperglycemia 08/09/2013   Routine general medical examination at a health care facility 08/08/2012   Abnormal electrocardiogram (ECG) (EKG) 09/11/2008   TOBACCO USE 08/28/2008   Essential hypertension, benign  08/28/2008   BPH (benign prostatic hyperplasia) 08/28/2008   History of colonic polyps 08/28/2008   Past Medical History:  Diagnosis Date   Colonic polyp    Hypertension    Past Surgical History:  Procedure Laterality Date   COLONOSCOPY WITH PROPOFOL N/A 10/17/2012   Procedure: COLONOSCOPY WITH PROPOFOL;  Surgeon: Petra Kuba, MD;  Location: [facility];   Service: Endoscopy;  Laterality: N/A;   EYE SURGERY     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 90 tablet 3   aspirin 81 MG chewable tablet Chew 1 tablet (81 mg total) by mouth daily. 90 tablet 3   chlorthalidone (HYGROTON) 25 MG tablet Take 1 tablet (25 mg total) by mouth daily. 90 tablet 3   irbesartan (AVAPRO) 300 MG tablet Take 1 tablet (300 mg total) by mouth daily. 90 tablet 3   rosuvastatin (CRESTOR) 10 MG tablet Take 1 tablet (10 mg total) by mouth daily. 90 tablet 3   metoprolol tartrate (LOPRESSOR) 50 MG tablet Take 1 tablet (50 mg total) by mouth 2 (two) times daily. 180 tablet 3   nicotine (NICODERM CQ - DOSED IN MG/24 HR) 7 mg/24hr patch Place 1 patch (7 mg total) onto the skin daily. (Patient not taking: Reported on 06/09/2023) 28 patch 0   No facility-administered medications prior to visit.   Allergies  Allergen Reactions   Amoxil [Amoxicillin] Hives    itching     ROS: A complete ROS was performed with pertinent positives/negatives noted in the HPI. The remainder of the ROS are negative.    Objective:   Today's Vitals   06/09/23 0823  BP: 109/66  Pulse: (!) 44  SpO2: 98%  Weight: 191 lb 12.8 oz (87 kg)  Height: 5\' 9"  (1.753 m)    Physical Exam          GENERAL: Well-appearing, in NAD. Well nourished.  SKIN: Pink, warm and dry. No rash, lesion, ulceration, or ecchymoses.  Head: Normocephalic. NECK: Trachea midline. Full ROM w/o pain or tenderness. RESPIRATORY: Chest wall symmetrical.  Respirations even and non-labored. Breath sounds clear to auscultation bilaterally.  CARDIAC: S1, S2 present, bradycardic rate and rhythm without murmur or gallops. Peripheral pulses 2+ bilaterally.  Carotid arteries without bruit or thrill MSK: Muscle tone and strength appropriate for age.  EXTREMITIES: Without clubbing, cyanosis, or edema.  NEUROLOGIC: No motor or sensory deficits. Steady, even gait. C2-C12 intact.  PSYCH/MENTAL STATUS: Alert, oriented x 3. Cooperative, appropriate mood and affect.   Health Maintenance Due  Topic Date Due   OPHTHALMOLOGY EXAM  Never done   HEMOGLOBIN A1C  05/21/2023      Assessment & Plan:  1. Aortic dissection distal to left subclavian (HCC) (Primary) Will obtain lipid panel today as part of evaluation for cholesterol control given history of aortic dissection.  Will likely continue on Crestor and titrate as needed pending results.  Discussed healthy lifestyle, smoking cessation, regular exercise for improvement. -  Lipid panel  2. Essential hypertension, benign Concern for hypotension and bradycardia with current beta-blocker dosage.  I recommended changing patient's metoprolol from 50 mg twice daily to 50 mg XR tablet once daily.  Patient is agreeable to this, but would like to wait until his CTA results before making change.  I did recommend while waiting for results, patient try 1/2 tablet of his current dosage twice a day (25 mg twice daily) and monitor his heart rate and blood pressure.  Patient was agreeable.  Will notify patient of results and likely change to extended release dosage for improvement in fatigue and heart rate.  Will continue to keep rate controlled between 60 to 80 bpm and systolic less than 120 is goal.  Obtain CMP with labs today. - Comprehensive metabolic panel  3. Type 2 diabetes mellitus without complication, without long-term current use of insulin (HCC) Patient previously wanted to control with diet and exercise.  Will evaluate  A1c today with lab work and referral placed to ophthalmology for diabetic eye exam needed within the next year. - Hemoglobin A1c - Ambulatory referral to Ophthalmology   Meds ordered this encounter  Medications   metoprolol tartrate (LOPRESSOR) 50 MG tablet    Sig: Take 0.5 tablets (25 mg total) by mouth 2 (two) times daily.    Supervising Provider:   DE Peru, RAYMOND J [[id number]]   Lab Orders         Comprehensive metabolic panel         Hemoglobin A1c         Lipid panel     Return in about 3 months (around 09/07/2023) for Follow up HTN/Aortic Dissection and HR.    Patient to reach out to office if new, worrisome, or unresolved symptoms arise or if no improvement in patient's condition. Patient verbalized understanding and is agreeable to treatment plan. All questions answered to patient's satisfaction.    Hilbert Bible, [place]

## 2023-06-10 LAB — COMPREHENSIVE METABOLIC PANEL
ALT: 15 [IU]/L (ref 0–44)
AST: 20 [IU]/L (ref 0–40)
Albumin: 4.6 g/dL (ref 3.9–4.9)
Alkaline Phosphatase: 73 [IU]/L (ref 44–121)
BUN/Creatinine Ratio: 18 (ref 10–24)
BUN: 23 mg/dL (ref 8–27)
Bilirubin Total: 0.7 mg/dL (ref 0.0–1.2)
CO2: 23 mmol/L (ref 20–29)
Calcium: 9.6 mg/dL (ref 8.6–10.2)
Chloride: 96 mmol/L (ref 96–106)
Creatinine, Ser: 1.26 mg/dL (ref 0.76–1.27)
Globulin, Total: 2 g/dL (ref 1.5–4.5)
Glucose: 112 mg/dL — ABNORMAL HIGH (ref 70–99)
Potassium: 4.3 mmol/L (ref 3.5–5.2)
Sodium: 135 mmol/L (ref 134–144)
Total Protein: 6.6 g/dL (ref 6.0–8.5)
eGFR: 61 mL/min/{1.73_m2} (ref >59)

## 2023-06-10 LAB — LIPID PANEL
Chol/HDL Ratio: 3.1 {ratio} (ref 0.0–5.0)
Cholesterol, Total: 97 mg/dL — ABNORMAL LOW (ref 100–199)
HDL: 31 mg/dL — ABNORMAL LOW (ref >39)
LDL Chol Calc (NIH): 46 mg/dL (ref 0–99)
Triglycerides: 105 mg/dL (ref 0–149)
VLDL Cholesterol Cal: 20 mg/dL (ref 5–40)

## 2023-06-10 LAB — HEMOGLOBIN A1C
Est. average glucose Bld gHb Est-mCnc: 146 mg/dL
Hgb A1c MFr Bld: 6.7 % — ABNORMAL HIGH (ref 4.8–5.6)

## 2023-06-13 ENCOUNTER — Encounter (HOSPITAL_BASED_OUTPATIENT_CLINIC_OR_DEPARTMENT_OTHER): Payer: Self-pay | Admitting: Family Medicine

## 2023-06-13 ENCOUNTER — Other Ambulatory Visit (HOSPITAL_BASED_OUTPATIENT_CLINIC_OR_DEPARTMENT_OTHER): Payer: Self-pay | Admitting: Family Medicine

## 2023-06-13 DIAGNOSIS — I10 Essential (primary) hypertension: Secondary | ICD-10-CM

## 2023-06-13 MED ORDER — METOPROLOL SUCCINATE ER 50 MG PO TB24: 50.0000 mg | ORAL_TABLET | Freq: Every day | ORAL | 3 refills | Status: DC

## 2023-06-13 NOTE — Telephone Encounter (Signed)
 Please see mychart sent by pt and advise.

## 2023-06-13 NOTE — Progress Notes (Signed)
Hi Jaime Castillo, Your kidney function and electrolytes are normal.  Your liver function has improved from last time as well.  Your cholesterol has increased slightly, but is still under control.  Your A1c is increased at 6.7 from 6.5 approximately 6 months ago.  At this time, I would recommend starting medication such as metformin to help lower your risk of progression of type 2 diabetes.  Please let me know if you are agreeable to this and I can send this in for you.  We would still plan on 44-month follow-up.  I did see that the aneurysm has decreased slightly in size.  I would recommend that we change you over to the metoprolol XR.  You can run this by vascular as well to see if they were agreeable.  How has your heart rate and blood pressure been with the half a tablet twice a day?

## 2023-06-20 ENCOUNTER — Encounter: Payer: Self-pay | Admitting: Surgery

## 2023-06-20 ENCOUNTER — Ambulatory Visit (INDEPENDENT_AMBULATORY_CARE_PROVIDER_SITE_OTHER): Payer: No Typology Code available for payment source | Admitting: Surgery

## 2023-06-20 VITALS — BP 131/81 | HR 51 | Temp 98.3°F | Resp 20 | Ht 69.0 in | Wt 193.0 lb

## 2023-06-20 DIAGNOSIS — I71019 Dissection of thoracic aorta, unspecified: Secondary | ICD-10-CM | POA: Diagnosis not present

## 2023-06-20 NOTE — Progress Notes (Signed)
 [facility]  Patient name: COLLIS FLEMING MRN: [id number] DOB: Oct 15, 1950 Sex: male   REASON FOR VISIT:    Follow up  HISOTRY OF PRESENT ILLNESS:    LEONTE RAVELING is a 73 y.o. male who returns today for follow-up.  The patient was diagnosed with intramural hematoma on 10/26/2022.  He also had a focal infrarenal aortic dissection.  He was treated by Dr. Shaunna Delaware who has since retired.  He is back today for follow-up.  He has quit smoking.  He does a good job keeping his blood pressure under control.   PAST MEDICAL HISTORY:   Past Medical History:  Diagnosis Date   Colonic polyp    Hypertension      FAMILY HISTORY:   Family History  Problem Relation Age of Onset   Cancer Mother    Cancer Other        colon ca 1st degree relative <60    SOCIAL HISTORY:   Social History   Tobacco Use   Smoking status: Former    Current packs/day: 0.75    Average packs/day: 0.8 packs/day for 78.1 years (64.8 ttl pk-yrs)    Types: Cigarettes    Start date: 35   Smokeless tobacco: Never  Substance Use Topics   Alcohol use: No    Comment: ber q week     ALLERGIES:   Allergies  Allergen Reactions   Amoxil [Amoxicillin] Hives    itching     CURRENT MEDICATIONS:   Current Outpatient Medications  Medication Sig Dispense Refill   amLODipine  (NORVASC ) 10 MG tablet Take 1 tablet (10 mg total) by mouth daily. 90 tablet 3   aspirin  81 MG chewable tablet Chew 1 tablet (81 mg total) by mouth daily. 90 tablet 3   chlorthalidone  (HYGROTON ) 25 MG tablet Take 1 tablet (25 mg total) by mouth daily. 90 tablet 3   irbesartan  (AVAPRO ) 300 MG tablet Take 1 tablet (300 mg total) by mouth daily. 90 tablet 3   metoprolol  succinate (TOPROL -XL) 50 MG 24 hr tablet Take 1 tablet (50 mg total) by mouth daily. Take with or immediately following a meal. 90 tablet 3   rosuvastatin  (CRESTOR ) 10 MG tablet Take 1 tablet (10 mg total) by  mouth daily. 90 tablet 3   No current facility-administered medications for this visit.    REVIEW OF SYSTEMS:   [X]  denotes positive finding, [ ]  denotes negative finding Cardiac  Comments:  Chest pain or chest pressure:    Shortness of breath upon exertion:    Short of breath when lying flat:    Irregular heart rhythm:        Vascular    Pain in calf, thigh, or hip brought on by ambulation:    Pain in feet at night that wakes you up from your sleep:     Blood clot in your veins:    Leg swelling:         Pulmonary    Oxygen at home:    Productive cough:     Wheezing:         Neurologic    Sudden weakness in arms or legs:     Sudden numbness in arms or legs:     Sudden onset of difficulty speaking or slurred speech:    Temporary loss of vision in one eye:     Problems with dizziness:         Gastrointestinal    Blood in stool:  Vomited blood:         Genitourinary    Burning when urinating:     Blood in urine:        Psychiatric    Major depression:         Hematologic    Bleeding problems:    Problems with blood clotting too easily:        Skin    Rashes or ulcers:        Constitutional    Fever or chills:      PHYSICAL EXAM:   Vitals:   06/20/23 1149  BP: 131/81  Pulse: (!) 51  Resp: 20  Temp: 98.3 F (36.8 C)  SpO2: 97%  Weight: 193 lb (87.5 kg)  Height: 5\' 9"  (1.753 m)    GENERAL: The patient is a well-nourished male, in no acute distress. The vital signs are documented above. CARDIAC: There is a regular rate and rhythm. PULMONARY: Non-labored respirations ABDOMEN: Soft and non-tender MUSCULOSKELETAL: There are no major deformities or cyanosis. NEUROLOGIC: No focal weakness or paresthesias are detected. SKIN: There are no ulcers or rashes noted. PSYCHIATRIC: The patient has a normal affect.  STUDIES:   I have reviewed the following CT scan: VASCULAR   1. Interval resolution of previously visualized descending thoracic aortic  penetrating atheromatous ulcerations compatible with continued healing of previously visualized intramural hematoma. 2. The previously described ascending thoracic aortic aneurysm (4.2 cm) has decreased in size now measuring up to only 3.7 cm. 3. 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 4. 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 5.  Aortic Atherosclerosis (ICD10-I70.0).   NON-VASCULAR   1. Similar appearing moderate upper lobe predominant centrilobular emphysema (ICD10-J43.9). 2. Similar appearing hyperattenuating ovoid mass in the anterior, superior mediastinum inferior to the thyroid  gland measuring approximately 2.1 x 1.7 cm. 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 Consider dedicated thyroid  ultrasound for further characterization. 3. No acute or significant abdominopelvic abnormality.  MEDICAL ISSUES:   Aortic dissection: This is mainly a have nearly resolved.  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  I will plan on repeat imaging in 1 year.    Marti Slates, MD, FACS [facility] [phone] Pager [phone]

## 2023-08-20 ENCOUNTER — Other Ambulatory Visit (HOSPITAL_BASED_OUTPATIENT_CLINIC_OR_DEPARTMENT_OTHER): Payer: Self-pay

## 2023-09-07 ENCOUNTER — Encounter (HOSPITAL_BASED_OUTPATIENT_CLINIC_OR_DEPARTMENT_OTHER): Payer: Self-pay | Admitting: Family Medicine

## 2023-09-07 ENCOUNTER — Ambulatory Visit (HOSPITAL_BASED_OUTPATIENT_CLINIC_OR_DEPARTMENT_OTHER): Payer: No Typology Code available for payment source | Admitting: Family Medicine

## 2023-09-07 VITALS — BP 126/69 | HR 53 | Ht 69.0 in | Wt 198.6 lb

## 2023-09-07 DIAGNOSIS — Z1211 Encounter for screening for malignant neoplasm of colon: Secondary | ICD-10-CM | POA: Diagnosis not present

## 2023-09-07 DIAGNOSIS — I10 Essential (primary) hypertension: Secondary | ICD-10-CM

## 2023-09-07 MED ORDER — METOPROLOL SUCCINATE ER 25 MG PO TB24
25.0000 mg | ORAL_TABLET | Freq: Every day | ORAL | 0 refills | Status: DC
Start: 2023-09-07 — End: 2023-09-19

## 2023-09-07 NOTE — Progress Notes (Signed)
 Subjective:   Jaime Castillo [birth year] 09/07/2023  Chief Complaint  Patient presents with   Medical Management of Chronic Issues    28-month follow up; states he has been doing good since last visit. Has been monitoring his BP at home that he wants to discuss those readings. Has not been able to take diet seriously recently and wants to postpone labs due to that.    HPI: Jaime Castillo presents today for re-assessment and management of chronic medical conditions.  HYPERTENSION: [name] presents for the medical management of hypertension. Patient is followed by Dr. 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 He has quit smoking and keeps BP well controlled. He was seen by [facility]ascular on 06/20/2023 with reduction in ascending thoracic aortic aneurysm from 4.2 cm to 3.7cm.  He has noticed ongoing fatigue that has mildly improved since switching to an extended release metoprolol .  Reports that his heart rate is consistently below 50, blood pressure well-controlled.  Patient's current hypertension medication regimen is: Amlodipine  10 mg, chlorthalidone  25 mg, irbesartan  300 mg, metoprolol  50 mg XR Patient is  currently taking prescribed medications for HTN.  Patient is  regularly keeping a check on BP at home.   Home Readings:  117/66 114/65 108/62 122/70  HR is consistently between 48-55 BP Readings from Last 3 Encounters:  09/07/23 126/69  06/20/23 131/81  06/09/23 109/66      The following portions of the patient's history were reviewed and updated as appropriate: past medical history, past surgical history, family history, social history, allergies, medications, and problem list.   Patient Active Problem List   Diagnosis Date Noted   Type 2 diabetes mellitus without complication, without long-term current use of insulin (HCC) 12/07/2022   Encounter for tobacco use cessation counseling 11/18/2022   Screening for colon  cancer 11/18/2022   Hospital discharge follow-up 11/18/2022   Encounter to establish care with new doctor 11/18/2022   Aortic dissection distal to left subclavian (HCC) 10/26/2022   Hyperlipidemia with target LDL less than 100 08/10/2016   Allergic rhinitis 08/09/2013   Hyperglycemia 08/09/2013   Routine general medical examination at a health care facility 08/08/2012   Abnormal electrocardiogram (ECG) (EKG) 09/11/2008   TOBACCO USE 08/28/2008   Essential hypertension, benign 08/28/2008   BPH (benign prostatic hyperplasia) 08/28/2008   History of colonic polyps 08/28/2008   Past Medical History:  Diagnosis Date   Colonic polyp    Hypertension    Past Surgical History:  Procedure Laterality Date   COLONOSCOPY WITH PROPOFOL  N/A 10/17/2012   Procedure: COLONOSCOPY WITH PROPOFOL ;  Surgeon: Mathew Solomon, MD;  Location: [facility];  Service: Endoscopy;  Laterality: N/A;   EYE SURGERY     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 90 tablet 3   aspirin  81 MG chewable tablet Chew 1 tablet (81 mg total) by mouth daily. 90 tablet 3  chlorthalidone  (HYGROTON ) 25 MG tablet Take 1 tablet (25 mg total) by mouth daily. 90 tablet 3   irbesartan  (AVAPRO ) 300 MG tablet Take 1 tablet (300 mg total) by mouth daily. 90 tablet 3   metoprolol  succinate (TOPROL -XL) 50 MG 24 hr tablet Take 1 tablet (50 mg total) by mouth daily. Take with or immediately following a meal. 90 tablet 3   rosuvastatin  (CRESTOR ) 10 MG tablet Take 1 tablet (10 mg  total) by mouth daily. 90 tablet 3   No facility-administered medications prior to visit.   Allergies  Allergen Reactions   Amoxil [Amoxicillin] Hives    itching     ROS: A complete ROS was performed with pertinent positives/negatives noted in the HPI. The remainder of the ROS are negative.    Objective:   Today's Vitals   09/07/23 0825  BP: 126/69  Pulse: (!) 53  SpO2: 97%  Weight: 198 lb 9.6 oz (90.1 kg)  Height: 5\' 9"  (1.753 m)    Physical Exam          GENERAL: Well-appearing, in NAD. Well nourished.  SKIN: Pink, warm and dry. No rash, lesion, ulceration, or ecchymoses.  Head: Normocephalic. NECK: Trachea midline. Full ROM w/o pain or tenderness. No lymphadenopathy.  EARS: Tympanic membranes are intact, translucent without bulging and without drainage. Appropriate landmarks visualized.  EYES: Conjunctiva clear without exudates. EOMI, PERRL, no drainage present.  NOSE: Septum midline w/o deformity. Nares patent, mucosa pink and non-inflamed w/o drainage. No sinus tenderness.  THROAT: Uvula midline. Oropharynx clear. Tonsils non-inflamed without exudate. Mucous membranes pink and moist.  RESPIRATORY: Chest wall symmetrical. Respirations even and non-labored. Breath sounds clear to auscultation bilaterally.  CARDIAC: S1, S2 present, regular rate and rhythm without murmur or gallops. Peripheral pulses 2+ bilaterally.  MSK: Muscle tone and strength appropriate for age. Joints w/o tenderness, redness, or swelling.  EXTREMITIES: Without clubbing, cyanosis, or edema.  NEUROLOGIC: No motor or sensory deficits. Steady, even gait. C2-C12 intact.  PSYCH/MENTAL STATUS: Alert, oriented x 3. Cooperative, appropriate mood and affect.   Health Maintenance Due  Topic Date Due   OPHTHALMOLOGY EXAM  Never done   COVID-19 Vaccine (5 - 2024-25 season) 01/09/2023    No results found for any visits on 09/07/23.  The ASCVD Risk score (Arnett DK, et al., 2019) failed to calculate for the  following reasons:   The valid total cholesterol range is 130 to 320 mg/dL     Assessment & Plan:  1. Essential hypertension, benign (Primary) Blood pressure well-controlled.  Given patient's significant fatigue and ongoing bradycardia that is likely contributing, recommend trial of metoprolol  25 mg XL for at least 15 days with monitoring of heart rate and blood pressure.  Patient agreeable and will notify PCP if improved with dosage change.  Goal is to keep blood pressure under 130/80 and heart rate at 50-60. - metoprolol  succinate (TOPROL -XL) 25 MG 24 hr tablet; Take 1 tablet (25 mg total) by mouth daily for 15 days.  Dispense: 15 tablet; Refill: 0  2. Screening for colon cancer - Ambulatory referral to Gastroenterology  Meds ordered this encounter  Medications   metoprolol  succinate (TOPROL -XL) 25 MG 24 hr tablet    Sig: Take 1 tablet (25 mg total) by mouth daily for 15 days.    Dispense:  15 tablet    Refill:  0    Supervising Provider:   DE Peru, RAYMOND J [[id number]]   Lab Orders  No laboratory test(s) ordered today    Return  in about 4 months (around 01/07/2024) for Follow up HTN, DM.    Patient to reach out to office if new, worrisome, or unresolved symptoms arise or if no improvement in patient's condition. Patient verbalized understanding and is agreeable to treatment plan. All questions answered to patient's satisfaction.    Nonda Bays, [place]

## 2023-09-18 ENCOUNTER — Other Ambulatory Visit (HOSPITAL_BASED_OUTPATIENT_CLINIC_OR_DEPARTMENT_OTHER): Payer: Self-pay | Admitting: Family Medicine

## 2023-09-18 DIAGNOSIS — I10 Essential (primary) hypertension: Secondary | ICD-10-CM

## 2023-09-19 ENCOUNTER — Encounter (HOSPITAL_BASED_OUTPATIENT_CLINIC_OR_DEPARTMENT_OTHER): Payer: Self-pay | Admitting: Family Medicine

## 2023-09-19 ENCOUNTER — Other Ambulatory Visit (HOSPITAL_BASED_OUTPATIENT_CLINIC_OR_DEPARTMENT_OTHER): Payer: Self-pay | Admitting: Family Medicine

## 2023-09-19 DIAGNOSIS — I10 Essential (primary) hypertension: Secondary | ICD-10-CM

## 2023-09-19 MED ORDER — METOPROLOL SUCCINATE ER 25 MG PO TB24: 25.0000 mg | ORAL_TABLET | Freq: Every day | ORAL | 3 refills | Status: AC

## 2023-09-19 NOTE — Telephone Encounter (Signed)
 Please see mychart message and attached media and advise.

## 2023-11-05 ENCOUNTER — Other Ambulatory Visit (HOSPITAL_BASED_OUTPATIENT_CLINIC_OR_DEPARTMENT_OTHER): Payer: Self-pay | Admitting: Family Medicine

## 2023-11-05 DIAGNOSIS — I71019 Dissection of thoracic aorta, unspecified: Secondary | ICD-10-CM

## 2023-11-05 DIAGNOSIS — I10 Essential (primary) hypertension: Secondary | ICD-10-CM

## 2023-11-06 MED ORDER — ROSUVASTATIN CALCIUM 10 MG PO TABS
10.0000 mg | ORAL_TABLET | Freq: Every day | ORAL | 3 refills | Status: AC
  Filled 2023-11-19: qty 90, 90d supply, fill #0
  Filled 2024-02-07: qty 90, 90d supply, fill #1
  Filled 2024-05-15: qty 90, 90d supply, fill #2

## 2023-11-06 MED ORDER — AMLODIPINE BESYLATE 10 MG PO TABS
10.0000 mg | ORAL_TABLET | Freq: Every day | ORAL | 3 refills | Status: AC
  Filled 2023-11-19: qty 90, 90d supply, fill #0
  Filled 2024-02-07: qty 90, 90d supply, fill #1
  Filled 2024-05-15: qty 90, 90d supply, fill #2

## 2023-11-06 MED ORDER — CHLORTHALIDONE 25 MG PO TABS
25.0000 mg | ORAL_TABLET | Freq: Every day | ORAL | 3 refills | Status: AC
  Filled 2023-11-19: qty 90, 90d supply, fill #0
  Filled 2024-02-07: qty 90, 90d supply, fill #1
  Filled 2024-05-15: qty 90, 90d supply, fill #2

## 2023-11-06 MED ORDER — IRBESARTAN 300 MG PO TABS
300.0000 mg | ORAL_TABLET | Freq: Every day | ORAL | 3 refills | Status: AC
  Filled 2023-11-19: qty 90, 90d supply, fill #0
  Filled 2024-02-07: qty 90, 90d supply, fill #1
  Filled 2024-05-15: qty 90, 90d supply, fill #2

## 2023-11-07 ENCOUNTER — Other Ambulatory Visit (HOSPITAL_BASED_OUTPATIENT_CLINIC_OR_DEPARTMENT_OTHER): Payer: Self-pay

## 2023-11-19 ENCOUNTER — Other Ambulatory Visit (HOSPITAL_BASED_OUTPATIENT_CLINIC_OR_DEPARTMENT_OTHER): Payer: Self-pay

## 2023-12-09 ENCOUNTER — Other Ambulatory Visit (HOSPITAL_BASED_OUTPATIENT_CLINIC_OR_DEPARTMENT_OTHER): Payer: Self-pay

## 2023-12-09 ENCOUNTER — Other Ambulatory Visit (HOSPITAL_BASED_OUTPATIENT_CLINIC_OR_DEPARTMENT_OTHER): Payer: Self-pay | Admitting: Family Medicine

## 2023-12-09 DIAGNOSIS — I10 Essential (primary) hypertension: Secondary | ICD-10-CM

## 2023-12-09 DIAGNOSIS — I71019 Dissection of thoracic aorta, unspecified: Secondary | ICD-10-CM

## 2024-03-02 ENCOUNTER — Other Ambulatory Visit (HOSPITAL_BASED_OUTPATIENT_CLINIC_OR_DEPARTMENT_OTHER): Payer: Self-pay

## 2024-03-02 MED ORDER — FLUZONE HIGH-DOSE 0.5 ML IM SUSY
0.5000 mL | PREFILLED_SYRINGE | Freq: Once | INTRAMUSCULAR | 0 refills | Status: AC
  Filled 2024-03-02: qty 0.5, 1d supply, fill #0
